# Patient Record
Sex: Female | Born: 1935 | Race: White | Hispanic: No | Smoking: Never smoker
Health system: Southern US, Community
[De-identification: ages and names within clinical notes are randomized; demographics above are authoritative.]

## PROBLEM LIST (undated history)

## (undated) DIAGNOSIS — F32A Depression, unspecified: Secondary | ICD-10-CM

## (undated) DIAGNOSIS — N179 Acute kidney failure, unspecified: Secondary | ICD-10-CM

## (undated) DIAGNOSIS — F0390 Unspecified dementia without behavioral disturbance: Secondary | ICD-10-CM

## (undated) DIAGNOSIS — E119 Type 2 diabetes mellitus without complications: Secondary | ICD-10-CM

## (undated) DIAGNOSIS — F329 Major depressive disorder, single episode, unspecified: Secondary | ICD-10-CM

## (undated) DIAGNOSIS — E7800 Pure hypercholesterolemia, unspecified: Secondary | ICD-10-CM

## (undated) DIAGNOSIS — F22 Delusional disorders: Secondary | ICD-10-CM

## (undated) DIAGNOSIS — N390 Urinary tract infection, site not specified: Secondary | ICD-10-CM

## (undated) DIAGNOSIS — F419 Anxiety disorder, unspecified: Secondary | ICD-10-CM

## (undated) DIAGNOSIS — I10 Essential (primary) hypertension: Secondary | ICD-10-CM

## (undated) DIAGNOSIS — A419 Sepsis, unspecified organism: Secondary | ICD-10-CM

## (undated) DIAGNOSIS — J189 Pneumonia, unspecified organism: Secondary | ICD-10-CM

---

## 2004-08-30 ENCOUNTER — Ambulatory Visit: Payer: Self-pay | Admitting: Hematology

## 2013-02-22 DIAGNOSIS — A419 Sepsis, unspecified organism: Secondary | ICD-10-CM

## 2013-02-22 DIAGNOSIS — N179 Acute kidney failure, unspecified: Secondary | ICD-10-CM

## 2013-02-22 HISTORY — DX: Sepsis, unspecified organism: A41.9

## 2013-02-22 HISTORY — DX: Acute kidney failure, unspecified: N17.9

## 2013-03-06 ENCOUNTER — Inpatient Hospital Stay (HOSPITAL_COMMUNITY)
Admission: AD | Admit: 2013-03-06 | Discharge: 2013-03-13 | DRG: 871 | Disposition: A | Discharge status: Skilled Nursing Facility | Payer: Medicare Other | Source: Other Acute Inpatient Hospital | Attending: Internal Medicine | Admitting: Internal Medicine

## 2013-03-06 ENCOUNTER — Inpatient Hospital Stay (HOSPITAL_COMMUNITY): Payer: Medicare Other

## 2013-03-06 ENCOUNTER — Encounter (HOSPITAL_COMMUNITY): Payer: Self-pay | Admitting: Pulmonary Disease

## 2013-03-06 ENCOUNTER — Inpatient Hospital Stay (HOSPITAL_COMMUNITY): Payer: Medicare Other | Admitting: Anesthesiology

## 2013-03-06 ENCOUNTER — Encounter (HOSPITAL_COMMUNITY): Payer: Medicare Other | Admitting: Anesthesiology

## 2013-03-06 DIAGNOSIS — R0902 Hypoxemia: Secondary | ICD-10-CM | POA: Diagnosis present

## 2013-03-06 DIAGNOSIS — E7800 Pure hypercholesterolemia, unspecified: Secondary | ICD-10-CM | POA: Diagnosis present

## 2013-03-06 DIAGNOSIS — I10 Essential (primary) hypertension: Secondary | ICD-10-CM | POA: Diagnosis present

## 2013-03-06 DIAGNOSIS — A419 Sepsis, unspecified organism: Principal | ICD-10-CM | POA: Diagnosis present

## 2013-03-06 DIAGNOSIS — G92 Toxic encephalopathy: Secondary | ICD-10-CM | POA: Diagnosis not present

## 2013-03-06 DIAGNOSIS — J690 Pneumonitis due to inhalation of food and vomit: Secondary | ICD-10-CM | POA: Diagnosis present

## 2013-03-06 DIAGNOSIS — N12 Tubulo-interstitial nephritis, not specified as acute or chronic: Secondary | ICD-10-CM | POA: Diagnosis present

## 2013-03-06 DIAGNOSIS — K6289 Other specified diseases of anus and rectum: Secondary | ICD-10-CM | POA: Diagnosis not present

## 2013-03-06 DIAGNOSIS — E872 Acidosis, unspecified: Secondary | ICD-10-CM | POA: Diagnosis present

## 2013-03-06 DIAGNOSIS — F411 Generalized anxiety disorder: Secondary | ICD-10-CM | POA: Diagnosis present

## 2013-03-06 DIAGNOSIS — A498 Other bacterial infections of unspecified site: Secondary | ICD-10-CM | POA: Diagnosis present

## 2013-03-06 DIAGNOSIS — N179 Acute kidney failure, unspecified: Secondary | ICD-10-CM | POA: Diagnosis present

## 2013-03-06 DIAGNOSIS — E876 Hypokalemia: Secondary | ICD-10-CM | POA: Diagnosis not present

## 2013-03-06 DIAGNOSIS — F22 Delusional disorders: Secondary | ICD-10-CM | POA: Diagnosis present

## 2013-03-06 DIAGNOSIS — E860 Dehydration: Secondary | ICD-10-CM | POA: Diagnosis present

## 2013-03-06 DIAGNOSIS — N390 Urinary tract infection, site not specified: Secondary | ICD-10-CM | POA: Diagnosis present

## 2013-03-06 DIAGNOSIS — G929 Unspecified toxic encephalopathy: Secondary | ICD-10-CM | POA: Diagnosis not present

## 2013-03-06 DIAGNOSIS — R6521 Severe sepsis with septic shock: Secondary | ICD-10-CM

## 2013-03-06 DIAGNOSIS — G9340 Encephalopathy, unspecified: Secondary | ICD-10-CM | POA: Diagnosis present

## 2013-03-06 DIAGNOSIS — K838 Other specified diseases of biliary tract: Secondary | ICD-10-CM | POA: Diagnosis present

## 2013-03-06 DIAGNOSIS — J189 Pneumonia, unspecified organism: Secondary | ICD-10-CM | POA: Clinically undetermined

## 2013-03-06 DIAGNOSIS — F0390 Unspecified dementia without behavioral disturbance: Secondary | ICD-10-CM | POA: Diagnosis present

## 2013-03-06 DIAGNOSIS — R000 Tachycardia, unspecified: Secondary | ICD-10-CM | POA: Diagnosis not present

## 2013-03-06 DIAGNOSIS — E119 Type 2 diabetes mellitus without complications: Secondary | ICD-10-CM | POA: Diagnosis present

## 2013-03-06 DIAGNOSIS — D649 Anemia, unspecified: Secondary | ICD-10-CM | POA: Diagnosis present

## 2013-03-06 DIAGNOSIS — K920 Hematemesis: Secondary | ICD-10-CM | POA: Diagnosis present

## 2013-03-06 HISTORY — DX: Type 2 diabetes mellitus without complications: E11.9

## 2013-03-06 HISTORY — DX: Pure hypercholesterolemia, unspecified: E78.00

## 2013-03-06 HISTORY — DX: Unspecified dementia, unspecified severity, without behavioral disturbance, psychotic disturbance, mood disturbance, and anxiety: F03.90

## 2013-03-06 HISTORY — DX: Essential (primary) hypertension: I10

## 2013-03-06 HISTORY — DX: Delusional disorders: F22

## 2013-03-06 HISTORY — DX: Anxiety disorder, unspecified: F41.9

## 2013-03-06 LAB — CBC
HCT: 27.7 % — ABNORMAL LOW (ref 36.0–46.0)
Hemoglobin: 9.4 g/dL — ABNORMAL LOW (ref 12.0–15.0)
MCHC: 33.9 g/dL (ref 30.0–36.0)
Platelets: 166 10*3/uL (ref 150–400)
RDW: 14.6 % (ref 11.5–15.5)
WBC: 7.3 10*3/uL (ref 4.0–10.5)

## 2013-03-06 LAB — GLUCOSE, CAPILLARY
Glucose-Capillary: 106 mg/dL — ABNORMAL HIGH (ref 70–99)
Glucose-Capillary: 142 mg/dL — ABNORMAL HIGH (ref 70–99)
Glucose-Capillary: 92 mg/dL (ref 70–99)
Glucose-Capillary: 99 mg/dL (ref 70–99)
Glucose-Capillary: 77 mg/dL (ref 70–99)
Glucose-Capillary: 76 mg/dL (ref 70–99)

## 2013-03-06 LAB — CBC WITH DIFFERENTIAL/PLATELET
Basophils Absolute: 0 10*3/uL (ref 0.0–0.1)
Eosinophils Absolute: 0 10*3/uL (ref 0.0–0.7)
Eosinophils Relative: 0 % (ref 0–5)
HCT: 29.7 % — ABNORMAL LOW (ref 36.0–46.0)
Lymphocytes Relative: 6 % — ABNORMAL LOW (ref 12–46)
MCH: 30.6 pg (ref 26.0–34.0)
MCHC: 32.3 g/dL (ref 30.0–36.0)
MCV: 94.6 fL (ref 78.0–100.0)
Monocytes Absolute: 1.1 10*3/uL — ABNORMAL HIGH (ref 0.1–1.0)
Platelets: 221 10*3/uL (ref 150–400)
RDW: 14.9 % (ref 11.5–15.5)
WBC: 11.8 10*3/uL — ABNORMAL HIGH (ref 4.0–10.5)

## 2013-03-06 LAB — TROPONIN I
Troponin I: <0.3 ng/mL (ref <0.30)
Troponin I: <0.3 ng/mL (ref <0.30)

## 2013-03-06 LAB — PROCALCITONIN: Procalcitonin: 10.1 ng/mL

## 2013-03-06 LAB — CORTISOL: Cortisol, Plasma: 25.8 ug/dL

## 2013-03-06 LAB — POCT I-STAT 3, ART BLOOD GAS (G3+)
Acid-base deficit: 3 mmol/L — ABNORMAL HIGH (ref 0.0–2.0)
Bicarbonate: 22.9 mEq/L (ref 20.0–24.0)
TCO2: 24 mmol/L (ref 0–100)
pH, Arterial: 7.306 — ABNORMAL LOW (ref 7.350–7.450)
pO2, Arterial: 76 mmHg — ABNORMAL LOW (ref 80.0–100.0)

## 2013-03-06 LAB — COMPREHENSIVE METABOLIC PANEL
ALT: 17 U/L (ref 0–35)
Albumin: 2.8 g/dL — ABNORMAL LOW (ref 3.5–5.2)
Alkaline Phosphatase: 51 U/L (ref 39–117)
BUN: 47 mg/dL — ABNORMAL HIGH (ref 6–23)
Chloride: 102 mEq/L (ref 96–112)
Creatinine, Ser: 2.91 mg/dL — ABNORMAL HIGH (ref 0.50–1.10)
Potassium: 4.9 mEq/L (ref 3.5–5.1)
Sodium: 138 mEq/L (ref 135–145)
Total Bilirubin: 0.2 mg/dL — ABNORMAL LOW (ref 0.3–1.2)
Total Protein: 5.1 g/dL — ABNORMAL LOW (ref 6.0–8.3)

## 2013-03-06 LAB — MRSA PCR SCREENING: MRSA by PCR: NEGATIVE

## 2013-03-06 LAB — LACTIC ACID, PLASMA: Lactic Acid, Venous: 1.4 mmol/L (ref 0.5–2.2)

## 2013-03-06 LAB — AMYLASE: Amylase: 192 U/L — ABNORMAL HIGH (ref 0–105)

## 2013-03-06 LAB — PRO B NATRIURETIC PEPTIDE: Pro B Natriuretic peptide (BNP): 788.5 pg/mL — ABNORMAL HIGH (ref 0–450)

## 2013-03-06 MED ORDER — DEXTROSE 5 % IV SOLN
2.0000 ug/min | INTRAVENOUS | Status: DC
  Administered 2013-03-06: 8 ug/min via INTRAVENOUS
  Filled 2013-03-06 (×2): qty 4

## 2013-03-06 MED ORDER — INSULIN ASPART 100 UNIT/ML ~~LOC~~ SOLN: 0.0000 [IU] | SUBCUTANEOUS | Status: DC

## 2013-03-06 MED ORDER — PANTOPRAZOLE SODIUM 40 MG IV SOLR
40.0000 mg | Freq: Two times a day (BID) | INTRAVENOUS | Status: DC
  Administered 2013-03-06 (×2): 40 mg via INTRAVENOUS
  Filled 2013-03-06 (×5): qty 40

## 2013-03-06 MED ORDER — LORAZEPAM 2 MG/ML IJ SOLN
0.5000 mg | INTRAMUSCULAR | Status: DC | PRN
  Administered 2013-03-06 (×2): 0.5 mg via INTRAVENOUS
  Filled 2013-03-06: qty 1

## 2013-03-06 MED ORDER — HEPARIN SODIUM (PORCINE) 5000 UNIT/ML IJ SOLN
5000.0000 [IU] | Freq: Three times a day (TID) | INTRAMUSCULAR | Status: DC
  Administered 2013-03-06 – 2013-03-07 (×4): 5000 [IU] via SUBCUTANEOUS
  Filled 2013-03-06 (×7): qty 1

## 2013-03-06 MED ORDER — SODIUM CHLORIDE 0.9 % IV SOLN
INTRAVENOUS | Status: AC
  Administered 2013-03-06 (×3): via INTRAVENOUS

## 2013-03-06 MED ORDER — DEXTROSE 50 % IV SOLN: 25.0000 mL | Freq: Once | INTRAVENOUS | Status: AC

## 2013-03-06 MED ORDER — PIPERACILLIN-TAZOBACTAM IN DEX 2-0.25 GM/50ML IV SOLN
2.2500 g | Freq: Three times a day (TID) | INTRAVENOUS | Status: DC
  Administered 2013-03-06 – 2013-03-07 (×4): 2.25 g via INTRAVENOUS
  Filled 2013-03-06 (×5): qty 50

## 2013-03-06 MED ORDER — DEXTROSE-NACL 5-0.9 % IV SOLN
INTRAVENOUS | Status: DC
  Administered 2013-03-06 – 2013-03-07 (×2): via INTRAVENOUS

## 2013-03-06 MED ORDER — PIPERACILLIN-TAZOBACTAM 3.375 G IVPB 30 MIN
3.3750 g | Freq: Once | INTRAVENOUS | Status: AC
  Administered 2013-03-06: 3.375 g via INTRAVENOUS
  Filled 2013-03-06: qty 50

## 2013-03-06 MED ORDER — SODIUM CHLORIDE 0.9 % IV BOLUS (SEPSIS)
500.0000 mL | Freq: Once | INTRAVENOUS | Status: AC
Start: 2013-03-06 — End: 2013-03-06
  Administered 2013-03-06: 500 mL via INTRAVENOUS

## 2013-03-06 MED ORDER — DEXTROSE 50 % IV SOLN
INTRAVENOUS | Status: AC
  Administered 2013-03-06: 25 mL
  Filled 2013-03-06: qty 50

## 2013-03-06 MED ORDER — INSULIN ASPART 100 UNIT/ML ~~LOC~~ SOLN
1.0000 [IU] | SUBCUTANEOUS | Status: DC
  Administered 2013-03-06: 1 [IU] via SUBCUTANEOUS

## 2013-03-06 MED ORDER — HALOPERIDOL LACTATE 5 MG/ML IJ SOLN: 1.0000 mg | INTRAMUSCULAR | Status: DC | PRN

## 2013-03-06 NOTE — Care Management Note (Addendum)
Page 1 of 2   03/12/2013     4:29:35 PM   CARE MANAGEMENT NOTE 03/12/2013  Patient:  Yolanda Medina, Yolanda Medina   Account Number:  [id number]  Date Initiated:  03/06/2013  Documentation initiated by:  Avie Arenas  Subjective/Objective Assessment:   tx from [facility] - septic, renal failure.     Action/Plan:   pt eval- rec hhpt   Anticipated DC Date:  03/12/2013   Anticipated DC Plan:  SKILLED NURSING FACILITY  In-house referral  Clinical Social Worker      DC Associate Professor  CM consult      [facility] Choice  HOME HEALTH   Choice offered to / List presented to:  C-1 Patient        HH arranged  HH-1 RN  HH-2 PT  HH-4 NURSE'S AIDE      HH agency  [facility]   Status of service:  Completed, signed off Medicare Important Message given?   (If response is "NO", the following Medicare IM given date fields will be blank) Date Medicare IM given:   Date Additional Medicare IM given:    Discharge Disposition:    Per UR Regulation:  Reviewed for med. necessity/level of care/duration of stay  If discussed at Long Length of Stay Meetings, dates discussed:    Comments:  ContactAugustina Mood [phone]                           Yolanda Medina Styles [phone]  03/12/13 16:27 Letha Cape RN, BSN [phone] patient has now decided to go to SNF, CSW has been contacted and spoke with patient.  Called [facility] and told them to put [facility] on hold until further notice.  CSW working up for SNF.  03/12/13 11:20 Letha Cape RN, BSN [phone] patient for dc today, patient chose [facility] , faxed to [facility].  Soc will begin within 24-48 hrs post discharge.   Patient has medication coverage and transportation at dc.  Received call from [facility] stating they could not take patient.  Informed patient and she chose [facility] , referral made to [facility] .  Patient for dc tomorrow.  Patient lives with Yolanda Medina her son , who is disabled, who Md  has not been able to contact.  MD  also left message for Yolanda Medina the other son on vm and he  has not called the MD back.    NCM left message for other son Yolanda Medina to call, beause pateint is trying to call him from her room to see if he would be able to pick her up at dc.  Recieved call from Yolanda Medina who states he was not satified with patient's care in hospital because he has not received any information from any MD about his mother.  Informed son that patient was just transferred to this medical unit yesterday and a new MD just started following patient. Yolanda Medina stated he will be here tomorrow to pick patient up at 10 am.  NCM informed MD of this information.  All previous notes in chart state patient is from SNF, but NCM spoke with Yolanda Medina on patient hospital phone while patient was talking to him and he states patient is from home with him and she has not been in a [place] representative.  Per physical therapy recs SNF for this admission and patient is refusing SNF at this time , stating she wants to go home. MD  is aware of this.  03-06-13  11am Avie Arenas, RNBSN [phone] Patient in bed - restless, mittens on.  Does not look at you when you address her name.  Nurse states lives with son, Yolanda Medina.  Called Timothy's above number and left message to call back.   Called Jerry's number.  Got a hold of his wife who referred [place] to Tim as mother does life with him.  Awaiting call back.

## 2013-03-06 NOTE — Progress Notes (Signed)
Hypoglycemic Event  CBG: 58  Treatment: D50 IV 25 mL  Symptoms: None  Follow-up CBG: Time:1245 CBG Result:99  Possible Reasons for Event: Inadequate meal intake  Comments/MD notified:Simonds    Yolanda Medina  Remember to initiate Hypoglycemia Order Set & complete

## 2013-03-06 NOTE — Progress Notes (Signed)
79yo female went from SNF to OSH for coffee-ground emesis and AMS, found to be in ARF, no clear evidence of GIB, concern for intra-abdominal infectious process per CT and possible aspiration PNA, to begin IV ABX.  Will start Zosyn 2.25g IV Q8H for CrCl currently <20 and monitor CrCl to adjust.  Vernard Gambles, PharmD, BCPS 03/06/2013 2:58 AM

## 2013-03-06 NOTE — Progress Notes (Signed)
RT attempted to stick [name] with no success. RT unable to thread catheter. RN aware.

## 2013-03-06 NOTE — Consult Note (Signed)
Reason for Consult:portal venous gas, sepsis Referring Physician: Dr Sueanne Margarita  Yolanda Medina is an 79 y.o. female.  HPI: 67 yof transferred from outside hospital with presumed gi bleed, shock, acute renal failure.  Patient is confused and I have reviewed Dr Brynda Greathouse note and notes from other hospital.  The patient is difficult to understand but did state she did not have abdominal pain.  She arrived at [facility] er with confusion and then was found to have hypotension and heme positive emesis.  Her cr was also elevated. Underwent ct head that showed some chronic changes.  CT a/p shows some portal venous gas but without any other real abnormality.  I have reviewed the scans as well as report. She appears to reside in a skilled nursing facility.  Past Medical History  Diagnosis Date  . Dementia   . Anxiety   . Paranoia   . Hypertension   . Hypercholesterolemia   . Diabetes mellitus     No past surgical history on file.- she has had hysterectomy  No family history on file.  Social History:  has no tobacco, alcohol, and drug history on file.  Allergies:  Allergies  Allergen Reactions  . Acetaminophen   . Ciprofloxacin   . Codeine     Medications: I have reviewed the patient's current medications.  Results for orders placed during the hospital encounter of 03/06/13 (from the past 48 hour(s))  MRSA PCR SCREENING     Status: None   Collection Time    03/06/13  1:44 AM      Result Value Range   MRSA by PCR NEGATIVE  NEGATIVE   Comment:            The GeneXpert MRSA Assay (FDA     approved for NASAL specimens     only), is one component of a     comprehensive MRSA colonization     surveillance program. It is not     intended to diagnose MRSA     infection nor to guide or     monitor treatment for     MRSA infections.  GLUCOSE, CAPILLARY     Status: Abnormal   Collection Time    03/06/13  1:46 AM      Result Value Range   Glucose-Capillary 106 (*) 70 - 99 mg/dL   Comment 1 Documented in Chart     Comment 2 Notify RN    TROPONIN I     Status: None   Collection Time    03/06/13  2:01 AM      Result Value Range   Troponin I <0.30  <0.30 ng/mL   Comment:            Due to the release kinetics of cTnI,     a negative result within the first hours     of the onset of symptoms does not rule out     myocardial infarction with certainty.     If myocardial infarction is still suspected,     repeat the test at appropriate intervals.  LACTIC ACID, PLASMA     Status: None   Collection Time    03/06/13  2:01 AM      Result Value Range   Lactic Acid, Venous 1.4  0.5 - 2.2 mmol/L  PROCALCITONIN     Status: None   Collection Time    03/06/13  2:01 AM      Result Value Range   Procalcitonin 10.10  Comment:            Interpretation:     PCT >= 10 ng/mL:     Important systemic inflammatory response,     almost exclusively due to severe bacterial     sepsis or septic shock.     (NOTE)             ICU PCT Algorithm               Non ICU PCT Algorithm        ----------------------------     ------------------------------             PCT < 0.25 ng/mL                 PCT < 0.1 ng/mL         Stopping of antibiotics            Stopping of antibiotics           strongly encouraged.               strongly encouraged.        ----------------------------     ------------------------------           PCT level decrease by               PCT < 0.25 ng/mL           >= 80% from peak PCT           OR PCT 0.25 - 0.5 ng/mL          Stopping of antibiotics                                                 encouraged.         Stopping of antibiotics               encouraged.        ----------------------------     ------------------------------           PCT level decrease by              PCT >= 0.25 ng/mL           < 80% from peak PCT            AND PCT >= 0.5 ng/mL            Continuing antibiotics                                                  encouraged.            Continuing antibiotics                encouraged.        ----------------------------     ------------------------------         PCT level increase compared          PCT > 0.5 ng/mL             with peak PCT AND              PCT >= 0.5 ng/mL  Escalation of antibiotics                                              strongly encouraged.          Escalation of antibiotics            strongly encouraged.  PRO B NATRIURETIC PEPTIDE     Status: Abnormal   Collection Time    03/06/13  2:01 AM      Result Value Range   Pro B Natriuretic peptide (BNP) 788.5 (*) 0 - 450 pg/mL  POCT I-STAT 3, BLOOD GAS (G3+)     Status: Abnormal   Collection Time    03/06/13  2:38 AM      Result Value Range   pH, Arterial 7.306 (*) 7.350 - 7.450   pCO2 arterial 46.2 (*) 35.0 - 45.0 mmHg   pO2, Arterial 76.0 (*) 80.0 - 100.0 mmHg   Bicarbonate 22.9  20.0 - 24.0 mEq/L   TCO2 24  0 - 100 mmol/L   O2 Saturation 93.0     Acid-base deficit 3.0 (*) 0.0 - 2.0 mmol/L   Patient temperature 99.4 F     Collection site RADIAL, ALLEN'S TEST ACCEPTABLE     Drawn by RT     Sample type ARTERIAL    COMPREHENSIVE METABOLIC PANEL     Status: Abnormal   Collection Time    03/06/13  2:45 AM      Result Value Range   Sodium 138  135 - 145 mEq/L   Potassium 4.9  3.5 - 5.1 mEq/L   Chloride 102  96 - 112 mEq/L   CO2 24  19 - 32 mEq/L   Glucose, Bld 122 (*) 70 - 99 mg/dL   BUN 47 (*) 6 - 23 mg/dL   Creatinine, Ser 0.34 (*) 0.50 - 1.10 mg/dL   Calcium 6.8 (*) 8.4 - 10.5 mg/dL   Total Protein 5.1 (*) 6.0 - 8.3 g/dL   Albumin 2.8 (*) 3.5 - 5.2 g/dL   AST 56 (*) 0 - 37 U/L   ALT 17  0 - 35 U/L   Alkaline Phosphatase 51  39 - 117 U/L   Total Bilirubin 0.2 (*) 0.3 - 1.2 mg/dL   GFR calc non Af Amer 15 (*) >90 mL/min   GFR calc Af Amer 17 (*) >90 mL/min   Comment: (NOTE)     The eGFR has been calculated using the CKD EPI equation.     This calculation has not been validated in all clinical situations.     eGFR's  persistently <90 mL/min signify possible Chronic Kidney     Disease.  AMYLASE     Status: Abnormal   Collection Time    03/06/13  2:45 AM      Result Value Range   Amylase 192 (*) 0 - 105 U/L  LIPASE, BLOOD     Status: None   Collection Time    03/06/13  2:45 AM      Result Value Range   Lipase 26  11 - 59 U/L  MAGNESIUM     Status: None   Collection Time    03/06/13  2:45 AM      Result Value Range   Magnesium 1.6  1.5 - 2.5 mg/dL  PHOSPHORUS     Status: Abnormal   Collection Time  03/06/13  2:45 AM      Result Value Range   Phosphorus 6.5 (*) 2.3 - 4.6 mg/dL  CBC WITH DIFFERENTIAL     Status: Abnormal   Collection Time    03/06/13  2:45 AM      Result Value Range   WBC 11.8 (*) 4.0 - 10.5 K/uL   RBC 3.14 (*) 3.87 - 5.11 MIL/uL   Hemoglobin 9.6 (*) 12.0 - 15.0 g/dL   HCT 82.9 (*) 56.2 - 13.0 %   MCV 94.6  78.0 - 100.0 fL   MCH 30.6  26.0 - 34.0 pg   MCHC 32.3  30.0 - 36.0 g/dL   RDW 86.5  78.4 - 69.6 %   Platelets 221  150 - 400 K/uL   Neutrophils Relative % 84 (*) 43 - 77 %   Neutro Abs 10.0 (*) 1.7 - 7.7 K/uL   Lymphocytes Relative 6 (*) 12 - 46 %   Lymphs Abs 0.8  0.7 - 4.0 K/uL   Monocytes Relative 9  3 - 12 %   Monocytes Absolute 1.1 (*) 0.1 - 1.0 K/uL   Eosinophils Relative 0  0 - 5 %   Eosinophils Absolute 0.0  0.0 - 0.7 K/uL   Basophils Relative 0  0 - 1 %   Basophils Absolute 0.0  0.0 - 0.1 K/uL  GLUCOSE, CAPILLARY     Status: Abnormal   Collection Time    03/06/13  5:14 AM      Result Value Range   Glucose-Capillary 142 (*) 70 - 99 mg/dL   Comment 1 Documented in Chart     Comment 2 Notify RN      Dg Chest Port 1 View  03/06/2013   CLINICAL DATA:  Hypoxia.  EXAM: PORTABLE CHEST - 1 VIEW  COMPARISON:  None.  FINDINGS: The lungs are mildly hypoexpanded. Retrocardiac airspace opacification raises concern for pneumonia. Mild vascular crowding and vascular congestion are seen. A small left pleural effusion is suspected. No pneumothorax is identified.   The cardiomediastinal silhouette is borderline normal in size. Calcification is noted within the aortic arch. No acute osseous abnormalities are seen. The patient is status post left-sided rotator cuff repair. An enteric tube is noted ending overlying the antrum of the stomach.  IMPRESSION: Lungs mildly hypoexpanded. Retrocardiac airspace opacification raises concern for pneumonia; suspect small left pleural effusion. Mild vascular congestion seen.   Electronically Signed   By: Roanna Raider M.D.   On: 03/06/2013 02:43    Review of Systems  Unable to perform ROS: dementia   Blood pressure 106/45, pulse 89, temperature 98.8 F (37.1 C), temperature source Oral, resp. rate 21, height 5\' 3"  (1.6 m), weight 143 lb 8.3 oz (65.1 kg), SpO2 97.00%. Physical Exam  Vitals reviewed. Constitutional: She appears well-developed.  HENT:  Head: Normocephalic and atraumatic.  Eyes: No scleral icterus.  Neck: Neck supple.  Cardiovascular: Normal rate, regular rhythm and normal heart sounds.   Respiratory: Effort normal and breath sounds normal. She has no wheezes.  GI: Soft. Normal appearance and bowel sounds are normal. She exhibits no distension. There is no tenderness. No hernia.    Lymphadenopathy:    She has no cervical adenopathy.    Assessment/Plan: Sepsis, ? Gi bleed, portal venous gas  No evidence of gi bleed that I can see right now.  On PPI.  Would trend hct, monitor ng output and bowel function Sepsis possibly intraabdominal etiology.  She does have some portal venous gas but  no source identified on her ct scan.  She has no abdominal tenderness on her exam either. I think conservative therapy with antibiotics, ng tube drainage as she has, monitor for bowel function, monitor hct is reasonable.  If she does not improve, worsens, develops abdominal pain then there is some chance she may need to be explored.  Isay Perleberg 03/06/2013, 6:11 AM

## 2013-03-06 NOTE — H&P (Signed)
PULMONARY  / CRITICAL CARE MEDICINE  Name: Yolanda Medina MRN: [id number] DOB: 07-12-1933    ADMISSION DATE:  03/06/2013 CONSULTATION DATE:  03/06/2013  REFERRING MD :  OSH ED PRIMARY SERVICE: PCCM  CHIEF COMPLAINT:  Confusion  BRIEF PATIENT DESCRIPTION: 79 y/o female with dementia who lives in a SNF was admitted on 11/13 in the early AM on transfer from the [facility] ED for confusion and a possible GI bleed.  SIGNIFICANT EVENTS / STUDIES:  11/12 CT head OSH> NAICP, small amount of maxillary sinus fluid, microvascular change 11/12 CT abdomen> no free air, small amount of portal venous gas,   LINES / TUBES: 11/12 L femoral vein cvl >>  CULTURES: 11/13 blood >> 11/13 urine >>  ANTIBIOTICS: 11/13 zosyn >>  HISTORY OF PRESENT ILLNESS:  79 y/o female with dementia who lives in a SNF was admitted on 11/13 in the early AM on transfer from the [facility] ED for confusion and a possible GI bleed.  She was confused on my exam and no family was available so history was obtained by chart review.  As I piece things together, it looks like she came in from her nursing home on 11/12 confused, hypoxemic and developed shock.  In the ED she vomited "coffee grounds" which were heme positive.  She was found to be in acute renal failure.    PAST MEDICAL HISTORY :  Past Medical History  Diagnosis Date  . Dementia   . Anxiety   . Paranoia   . Hypertension   . Hypercholesterolemia   . Diabetes mellitus    No past surgical history on file. Prior to Admission medications   Medication Sig Start Date End Date Taking? Authorizing Provider  albuterol (PROVENTIL HFA;VENTOLIN HFA) 108 (90 BASE) MCG/ACT inhaler Inhale into the lungs every 4 (four) hours as needed for wheezing or shortness of breath.   Yes Historical Provider, MD  alprazolam Prudy Feeler) 2 MG tablet Take 2 mg by mouth 3 (three) times daily as needed for sleep.   Yes Historical Provider, MD  chlordiazePOXIDE (LIBRIUM) 10 MG capsule Take 10 mg  by mouth at bedtime as needed (sleep).   Yes Historical Provider, MD  chlorzoxazone (PARAFON) 500 MG tablet Take 750 mg by mouth 3 (three) times daily.   Yes Historical Provider, MD  citalopram (CELEXA) 40 MG tablet Take 40 mg by mouth daily.   Yes Historical Provider, MD  doxepin (SINEQUAN) 50 MG capsule Take 50 mg by mouth at bedtime as needed.   Yes Historical Provider, MD  gabapentin (NEURONTIN) 300 MG capsule Take 300 mg by mouth 3 (three) times daily.   Yes Historical Provider, MD  hydrOXYzine (ATARAX/VISTARIL) 25 MG tablet Take 25 mg by mouth 3 (three) times daily as needed.   Yes Historical Provider, MD  lisinopril (PRINIVIL,ZESTRIL) 20 MG tablet Take 20 mg by mouth daily.   Yes Historical Provider, MD  metFORMIN (GLUCOPHAGE) 850 MG tablet Take 850 mg by mouth 2 (two) times daily with a meal.   Yes Historical Provider, MD  omeprazole (PRILOSEC) 20 MG capsule Take 20 mg by mouth daily.   Yes Historical Provider, MD  oxybutynin (DITROPAN) 5 MG tablet Take 5 mg by mouth 2 (two) times daily.   Yes Historical Provider, MD  oxyCODONE-acetaminophen (PERCOCET) 10-325 MG per tablet Take 1 tablet by mouth every 12 (twelve) hours.   Yes Historical Provider, MD  pravastatin (PRAVACHOL) 20 MG tablet Take 20 mg by mouth daily.   Yes Historical Provider, MD  temazepam (RESTORIL) 30 MG capsule Take 30 mg by mouth at bedtime as needed for sleep.   Yes Historical Provider, MD  traMADol (ULTRAM) 50 MG tablet Take by mouth every 6 (six) hours as needed for moderate pain.   Yes Historical Provider, MD  zolpidem (AMBIEN) 10 MG tablet Take 10 mg by mouth at bedtime as needed for sleep.   Yes Historical Provider, MD   Allergies  Allergen Reactions  . Acetaminophen   . Ciprofloxacin   . Codeine     FAMILY HISTORY/SOCIAL HISTORY/REVIEW OF SYSTEMS:  Cannot obtain due to confusion  SUBJECTIVE:   VITAL SIGNS: Weight:  [64.229 kg (141 lb 9.6 oz)] 64.229 kg (141 lb 9.6 oz) (11/13 0100) HEMODYNAMICS:    VENTILATOR SETTINGS:   INTAKE / OUTPUT: Intake/Output   None     PHYSICAL EXAMINATION: General:  Chronically ill appearing, confused Neuro:  Confused, follows simple commands, moves all four ext HEENT:  NCAT, PERRL, EOMi, MM profoundly dry Cardiovascular:  RRR, systolic murmur noted Lungs:  CTA B Abdomen: BS infrequent but noted, nontender, no guarding or rebound Musculoskeletal:  Normal bulk and tone Skin:  No rash or skin breakdown  LABS:  OSH labs reviewed, WBC 10, Hgb 13.1, PLT 262, normal electrolytes, C 2.8, GAP 20; ABG with metabolic acidosis (pH 7.20), lactic acid 1.2  CBC No results found for this basename: WBC, HGB, HCT, PLT,  in the last 168 hours Coag's No results found for this basename: APTT, INR,  in the last 168 hours BMET No results found for this basename: NA, K, CL, CO2, BUN, CREATININE, GLUCOSE,  in the last 168 hours Electrolytes No results found for this basename: CALCIUM, MG, PHOS,  in the last 168 hours Sepsis Markers No results found for this basename: LATICACIDVEN, PROCALCITON, O2SATVEN,  in the last 168 hours ABG No results found for this basename: PHART, PCO2ART, PO2ART,  in the last 168 hours Liver Enzymes No results found for this basename: AST, ALT, ALKPHOS, BILITOT, ALBUMIN,  in the last 168 hours Cardiac Enzymes No results found for this basename: TROPONINI, PROBNP,  in the last 168 hours Glucose  Recent Labs Lab 03/06/13 0146  GLUCAP 106*    Imaging No results found.   CXR: pending EKG: NSR, LBBB  ASSESSMENT / PLAN:  GASTROINTESTINAL A:   Nausea and vomiting > gastroenteritis, no clear evidence of GI bleed Portal venous gas > uncertain etiology, worrisome for more severe enteric pathology, but exam normal P:   -consult general surgery -LFT's now -amylase/lipase now -NG tube -change PPI drip to bid -repeat CBC  INFECTIOUS A:  Severe sepsis > most worrisome for intra-abdominal pathology given CT findings but exam  normal; other possible source aspiration pneumonia, less likely UTI P:   -repeat U/A and culture -CXR  PULMONARY A: Mild hypoxemia from aspiration pneumonia P:   -O2 as needed -HOB > 30 degrees  CARDIOVASCULAR A: Shock> presumably septic, appears profoundly hypovolemic on exam P:  -check lactic acid now -a-line now -bolus more saline now and increase rate to 200cc/hr for 8 hours -tele -continue femoral CVL for now (placed under sterile conditions), but consider change to IJ or subclavian if still needs pressors after fluid bolus -consider a-line if no improvement with IVF -EKG -cortisol -wean levophed  RENAL A:  AKI Anion gap metabolic acidosis P:   -check lactic acid now -abg now -bolus saline -monitor UOP -check U/A   HEMATOLOGIC A:  GI Bleed? No clear evidence  P:  -change  PPI to bid -monitor cbc  ENDOCRINE A:  DM2 P:   -ICU hyperglycemia protocol  NEUROLOGIC A:  Acute encephalopathy from sepsis Dementia  Polypharmacy clearly contributing to encephalopathy (multiple benzo's on med list) P:   -hold home meds -minimize sedating meds  Code: presumed full Family: tried calling listed contact  TODAY'S SUMMARY:   I have personally obtained a history, examined the patient, evaluated laboratory and imaging results, formulated the assessment and plan and placed orders. CRITICAL CARE: The patient is critically ill with multiple organ systems failure and requires high complexity decision making for assessment and support, frequent evaluation and titration of therapies, application of advanced monitoring technologies and extensive interpretation of multiple databases. Critical Care Time devoted to patient care services described in this note is 60 minutes.   Fonnie Jarvis Pulmonary and Critical Care Medicine [facility] Pager: [phone]  03/06/2013, 2:13 AM

## 2013-03-06 NOTE — Progress Notes (Signed)
PULMONARY  / CRITICAL CARE MEDICINE  Name: Yolanda Medina MRN: [id number] DOB: February 08, 1934    ADMISSION DATE:  03/06/2013 CONSULTATION DATE:  03/06/2013  REFERRING MD :  [facility] ED PRIMARY SERVICE: PCCM  CHIEF COMPLAINT:  Confusion  BRIEF PATIENT DESCRIPTION: 79 y/o female with dementia who lives in a SNF was admitted on 11/13 in the early AM on transfer from the [place] ED for confusion and a possible GI bleed.  SIGNIFICANT EVENTS / STUDIES:  11/12 CT head [facility] Salvia):  NAICP 11/12 CT abdomen:  no free air, small amount of portal venous gas 11/13 Off vasopressors.    LINES / TUBES: L fem CVL 11/12 >>   MICRO: PCT 11/13: 10.13,   11/14:     11/15:   blood 11/13 >> urine 11/13 >>  ANTIBIOTICS: Pip-tazo 11/13 >>   SUBJECTIVE:  No distress. Off pressors. + F/C. Poorly oriented   VITAL SIGNS: Temp:  [98.2 F (36.8 C)-100.5 F (38.1 C)] 100.5 F (38.1 C) (11/13 1100) Pulse Rate:  [86-109] 108 (11/13 1300) Resp:  [11-27] 19 (11/13 1300) BP: (95-125)/(27-92) 125/52 mmHg (11/13 1200) SpO2:  [95 %-100 %] 97 % (11/13 1300) Arterial Line BP: (115-157)/(31-46) 132/35 mmHg (11/13 1300) Weight:  [64.229 kg (141 lb 9.6 oz)-65.1 kg (143 lb 8.3 oz)] 65.1 kg (143 lb 8.3 oz) (11/13 0538) HEMODYNAMICS:   VENTILATOR SETTINGS:   INTAKE / OUTPUT: Intake/Output     11/12 0701 - 11/13 0700 11/13 0701 - 11/14 0700   I.V. (mL/kg) 1156.6 (17.8) 304.1 (4.7)   Other 2200 600   IV Piggyback 600 50   Total Intake(mL/kg) 3956.6 (60.8) 954.1 (14.7)   Urine (mL/kg/hr) 400 400 (0.9)   Emesis/NG output 100 25 (0.1)   Total Output 500 425   Net +3456.6 +529.1          PHYSICAL EXAMINATION: General:  Chronically ill appearing, confused Neuro:  Confused, follows simple commands, moves all four ext HEENT:  NCAT, PERRL, EOMi, MM profoundly dry Cardiovascular:  RRR, systolic murmur noted Lungs:  CTA B Abdomen: BS infrequent but noted, nontender, no guarding or rebound Musculoskeletal:   Normal bulk and tone Skin:  No rash or skin breakdown  LABS: I have reviewed all of today's lab results. Relevant abnormalities are discussed in the A/P section   CXR: NNF    ASSESSMENT / PLAN:  GASTROINTESTINAL A:   Nausea and vomiting Suspect gastoenteritis Portal venous gas, unclear etiology P:   SUP: IV PPI CCS following Cont NPO until more physiologically stable  INFECTIOUS A:  Severe sepsis, likely abdominal source P:   Micro and abx as above  CARDIOVASCULAR A: Septic shock, resolving Hypovolemic shock, resolved P:  Decrease IVFs to "maintenance" rate Monitor Resume NE as needed to keep MAP > 60 mmHg  PULMONARY A: Mild hypoxemia Possible aspiration  P:   Supplemental O2 to maintain SpO2 > 90% Monitor CXR intermittently  RENAL A:  AKI Mild AG acidosis Oliguria  P:   Monitor BMET intermittently Correct electrolytes as indicated Place Foley cath to track I/Os more closely  HEMATOLOGIC A:   Mild anemia without acute blood loss P:  Monitor CBC intermittently Transfuse for acute blood loss or Hgb < 7.0  ENDOCRINE A:  DM2 Episodic hypoglycemia P:   -ICU hyperglycemia protocol  NEUROLOGIC A: Acute encephalopathy Dementia  Polypharmacy P:    Minimize sedating meds   TODAY'S SUMMARY:   I have personally obtained a history, examined the patient, evaluated laboratory and imaging results, formulated  the assessment and plan and placed orders. CRITICAL CARE: The patient is critically ill with multiple organ systems failure and requires high complexity decision making for assessment and support, frequent evaluation and titration of therapies, application of advanced monitoring technologies and extensive interpretation of multiple databases. Critical Care Time devoted to patient care services described in this note is 30 minutes.   Sharlot Gowda Pulmonary and Critical Care Medicine [facility] Pager: [phone]  03/06/2013, 2:01  PM

## 2013-03-07 ENCOUNTER — Inpatient Hospital Stay (HOSPITAL_COMMUNITY): Payer: Medicare Other

## 2013-03-07 DIAGNOSIS — R109 Unspecified abdominal pain: Secondary | ICD-10-CM

## 2013-03-07 DIAGNOSIS — A419 Sepsis, unspecified organism: Secondary | ICD-10-CM

## 2013-03-07 DIAGNOSIS — N390 Urinary tract infection, site not specified: Secondary | ICD-10-CM | POA: Diagnosis present

## 2013-03-07 DIAGNOSIS — G9340 Encephalopathy, unspecified: Secondary | ICD-10-CM

## 2013-03-07 DIAGNOSIS — N179 Acute kidney failure, unspecified: Secondary | ICD-10-CM

## 2013-03-07 LAB — CBC
HCT: 26 % — ABNORMAL LOW (ref 36.0–46.0)
MCHC: 32.7 g/dL (ref 30.0–36.0)
MCV: 93.2 fL (ref 78.0–100.0)
RDW: 14.8 % (ref 11.5–15.5)

## 2013-03-07 LAB — COMPREHENSIVE METABOLIC PANEL
ALT: 17 U/L (ref 0–35)
AST: 47 U/L — ABNORMAL HIGH (ref 0–37)
Albumin: 2.4 g/dL — ABNORMAL LOW (ref 3.5–5.2)
Alkaline Phosphatase: 53 U/L (ref 39–117)
CO2: 26 mEq/L (ref 19–32)
GFR calc Af Amer: 40 mL/min — ABNORMAL LOW (ref >90)
Glucose, Bld: 87 mg/dL (ref 70–99)
Potassium: 4.4 mEq/L (ref 3.5–5.1)
Sodium: 145 mEq/L (ref 135–145)
Total Bilirubin: 0.2 mg/dL — ABNORMAL LOW (ref 0.3–1.2)
Total Protein: 4.7 g/dL — ABNORMAL LOW (ref 6.0–8.3)

## 2013-03-07 LAB — GLUCOSE, CAPILLARY
Glucose-Capillary: 77 mg/dL (ref 70–99)
Glucose-Capillary: 93 mg/dL (ref 70–99)
Glucose-Capillary: 117 mg/dL — ABNORMAL HIGH (ref 70–99)

## 2013-03-07 MED ORDER — HALOPERIDOL LACTATE 5 MG/ML IJ SOLN
1.0000 mg | INTRAMUSCULAR | Status: DC | PRN
  Administered 2013-03-07 – 2013-03-08 (×4): 4 mg via INTRAVENOUS
  Filled 2013-03-07 (×3): qty 1

## 2013-03-07 MED ORDER — DEXTROSE 5 % IV SOLN
INTRAVENOUS | Status: DC
  Administered 2013-03-07: 12:00:00 via INTRAVENOUS

## 2013-03-07 MED ORDER — TRAZODONE HCL 100 MG PO TABS
100.0000 mg | ORAL_TABLET | Freq: Every day | ORAL | Status: DC
  Administered 2013-03-07 – 2013-03-12 (×6): 100 mg via ORAL
  Filled 2013-03-07 (×9): qty 1

## 2013-03-07 MED ORDER — INSULIN ASPART 100 UNIT/ML ~~LOC~~ SOLN: 0.0000 [IU] | Freq: Every day | SUBCUTANEOUS | Status: DC

## 2013-03-07 MED ORDER — LEVALBUTEROL HCL 0.63 MG/3ML IN NEBU
INHALATION_SOLUTION | RESPIRATORY_TRACT | Status: AC
  Filled 2013-03-07: qty 3

## 2013-03-07 MED ORDER — PIPERACILLIN-TAZOBACTAM 3.375 G IVPB
3.3750 g | Freq: Three times a day (TID) | INTRAVENOUS | Status: DC
  Administered 2013-03-08 – 2013-03-13 (×17): 3.375 g via INTRAVENOUS
  Filled 2013-03-07 (×23): qty 50

## 2013-03-07 MED ORDER — ALPRAZOLAM 0.5 MG PO TABS
1.0000 mg | ORAL_TABLET | Freq: Three times a day (TID) | ORAL | Status: DC | PRN
  Administered 2013-03-07 – 2013-03-12 (×7): 1 mg via ORAL
  Filled 2013-03-07 (×6): qty 2
  Filled 2013-03-07: qty 4
  Filled 2013-03-07: qty 2

## 2013-03-07 MED ORDER — INSULIN ASPART 100 UNIT/ML ~~LOC~~ SOLN
0.0000 [IU] | Freq: Three times a day (TID) | SUBCUTANEOUS | Status: DC
  Administered 2013-03-08 – 2013-03-10 (×2): 1 [IU] via SUBCUTANEOUS

## 2013-03-07 MED ORDER — MORPHINE SULFATE 2 MG/ML IJ SOLN
INTRAMUSCULAR | Status: AC
  Administered 2013-03-07: 2 mg via INTRAVENOUS
  Filled 2013-03-07: qty 1

## 2013-03-07 MED ORDER — METOPROLOL TARTRATE 1 MG/ML IV SOLN
2.5000 mg | INTRAVENOUS | Status: DC | PRN
  Administered 2013-03-08: 5 mg via INTRAVENOUS
  Filled 2013-03-07: qty 5

## 2013-03-07 MED ORDER — HALOPERIDOL LACTATE 5 MG/ML IJ SOLN
10.0000 mg | Freq: Once | INTRAMUSCULAR | Status: AC
  Administered 2013-03-07: 10 mg via INTRAVENOUS
  Filled 2013-03-07: qty 2

## 2013-03-07 MED ORDER — LEVALBUTEROL HCL 0.63 MG/3ML IN NEBU
0.6300 mg | INHALATION_SOLUTION | Freq: Four times a day (QID) | RESPIRATORY_TRACT | Status: DC
  Administered 2013-03-07 – 2013-03-13 (×21): 0.63 mg via RESPIRATORY_TRACT
  Filled 2013-03-07 (×29): qty 3

## 2013-03-07 MED ORDER — ENOXAPARIN SODIUM 30 MG/0.3ML ~~LOC~~ SOLN
30.0000 mg | SUBCUTANEOUS | Status: DC
  Administered 2013-03-07 – 2013-03-10 (×4): 30 mg via SUBCUTANEOUS
  Filled 2013-03-07 (×6): qty 0.3

## 2013-03-07 MED ORDER — HYDRALAZINE HCL 20 MG/ML IJ SOLN
10.0000 mg | INTRAMUSCULAR | Status: DC | PRN
  Administered 2013-03-08: 10 mg via INTRAVENOUS
  Administered 2013-03-09: 20 mg via INTRAVENOUS
  Filled 2013-03-07 (×2): qty 1

## 2013-03-07 MED ORDER — PANTOPRAZOLE SODIUM 40 MG IV SOLR
40.0000 mg | Freq: Every day | INTRAVENOUS | Status: DC
  Administered 2013-03-07: 40 mg via INTRAVENOUS
  Filled 2013-03-07 (×2): qty 40

## 2013-03-07 NOTE — Progress Notes (Signed)
Patient ID: Yolanda Medina, female   DOB: 1933/07/21, 79 y.o.   MRN: [id number]    Subjective: Pt pulled NGT out this morning.  Agree with leaving it out.  No nausea.  Hungry.  C/o some LLQ abdominal tenderness  Objective: Vital signs in last 24 hours: Temp:  [98 F (36.7 C)-100.9 F (38.3 C)] 98 F (36.7 C) (11/14 0400) Pulse Rate:  [95-120] 109 (11/14 0800) Resp:  [15-27] 18 (11/14 0800) BP: (96-151)/(49-92) 151/58 mmHg (11/14 0800) SpO2:  [93 %-100 %] 97 % (11/14 0800) Arterial Line BP: (103-160)/(28-60) 160/56 mmHg (11/14 0800) Weight:  [143 lb 11.8 oz (65.2 kg)] 143 lb 11.8 oz (65.2 kg) (11/14 0600)    Intake/Output from previous day: 11/13 0701 - 11/14 0700 In: 1964.1 [I.V.:1154.1; NG/GT:60; IV Piggyback:150] Out: 1660 [Urine:1510; Emesis/NG output:150] Intake/Output this shift: Total I/O In: -  Out: 75 [Urine:75]  PE: Abd: soft, mild LLQ tenderness, otherwise NT, ND, +BS  Lab Results:   Recent Labs  03/06/13 1440 03/07/13 0435  WBC 7.3 8.8  HGB 9.4* 8.5*  HCT 27.7* 26.0*  PLT 166 153   BMET  Recent Labs  03/06/13 0245 03/07/13 0435  NA 138 145  K 4.9 4.4  CL 102 112  CO2 24 26  GLUCOSE 122* 87  BUN 47* 40*  CREATININE 2.91* 1.42*  CALCIUM 6.8* 7.3*   PT/INR No results found for this basename: LABPROT, INR,  in the last 72 hours CMP     Component Value Date/Time   NA 145 03/07/2013 0435   K 4.4 03/07/2013 0435   CL 112 03/07/2013 0435   CO2 26 03/07/2013 0435   GLUCOSE 87 03/07/2013 0435   BUN 40* 03/07/2013 0435   CREATININE 1.42* 03/07/2013 0435   CALCIUM 7.3* 03/07/2013 0435   PROT 4.7* 03/07/2013 0435   ALBUMIN 2.4* 03/07/2013 0435   AST 47* 03/07/2013 0435   ALT 17 03/07/2013 0435   ALKPHOS 53 03/07/2013 0435   BILITOT 0.2* 03/07/2013 0435   GFRNONAA 35* 03/07/2013 0435   GFRAA 40* 03/07/2013 0435   Lipase     Component Value Date/Time   LIPASE 26 03/06/2013 0245       Studies/Results: Dg Chest Port 1  View  03/07/2013   CLINICAL DATA:  Respiratory failure  EXAM: PORTABLE CHEST - 1 VIEW  COMPARISON:  03/06/2013  FINDINGS: Cardiac shadow is stable. The nasogastric catheter is been removed in the interval. Mild vascular congestion is again seen. Persistent left lower lobe infiltrative changes are seen with associated effusion.  IMPRESSION: No change from the prior exam.   Electronically Signed   By: Alcide Clever M.D.   On: 03/07/2013 07:39   Dg Chest Port 1 View  03/06/2013   CLINICAL DATA:  Hypoxia.  EXAM: PORTABLE CHEST - 1 VIEW  COMPARISON:  None.  FINDINGS: The lungs are mildly hypoexpanded. Retrocardiac airspace opacification raises concern for pneumonia. Mild vascular crowding and vascular congestion are seen. A small left pleural effusion is suspected. No pneumothorax is identified.  The cardiomediastinal silhouette is borderline normal in size. Calcification is noted within the aortic arch. No acute osseous abnormalities are seen. The patient is status post left-sided rotator cuff repair. An enteric tube is noted ending overlying the antrum of the stomach.  IMPRESSION: Lungs mildly hypoexpanded. Retrocardiac airspace opacification raises concern for pneumonia; suspect small left pleural effusion. Mild vascular congestion seen.   Electronically Signed   By: Roanna Raider M.D.   On: 03/06/2013 02:43  Anti-infectives: Anti-infectives   Start     Dose/Rate Route Frequency Ordered Stop   03/06/13 0800  piperacillin-tazobactam (ZOSYN) IVPB 2.25 g     2.25 g 100 mL/hr over 30 Minutes Intravenous Every 8 hours 03/06/13 0256     03/06/13 0215  piperacillin-tazobactam (ZOSYN) IVPB 3.375 g     3.375 g 100 mL/hr over 30 Minutes Intravenous  Once 03/06/13 0204 03/06/13 0339       Assessment/Plan  1. Sepsis, improving 2. Portal Venous gas, unknown significance  3. LLQ abdominal tenderness Patient Active Problem List   Diagnosis Date Noted  . Septic shock(785.52) 03/06/2013  . AKI (acute  kidney injury) 03/06/2013  . Encephalopathy acute 03/06/2013  . Pneumobilia 03/06/2013   Plan: 1. The patient's NGT was pulled out.  Would not replace at this point.  She has no further nausea and vomiting.  She is hungry.   2. CT scan is unable to be located.  The report says nothing of concern about diverticulitis or any other process seen. 3. Suspect it may be ok to try clear liquids today.  Will D/W MD   LOS: 1 day    [name] 03/07/2013, 8:52 AM Pager: [phone]

## 2013-03-07 NOTE — Significant Event (Addendum)
Patient transferred to 2600, traveled via bed, on 2L [place], patient is settled in bed. Patient remained anxious and confused, oriented to self only, wanting to get "gas" or needing to do "garden". Patient is agitated at times today, but has been able to be resettled after reassurance and education.  Bed-alarm was turned on. All patient's belongings at bedside of 2617, receiving staff made aware. Patient's ex-spouse and sons made aware of the transfer. MD Simond was made aware of patient's anxiety and agitation. New orders received.   RN remained with patient until she was settled and more calmer. Report given to receiving RN, Eber Jones; all questions answered. Receiving RN did not have any other questions prior to RN leaving.  Barbarita Hutmacher, Charity fundraiser.

## 2013-03-07 NOTE — Progress Notes (Signed)
ANTIBIOTIC CONSULT NOTE - FOLLOW UP  Pharmacy Consult:  Zosyn Indication:  Empiric for aspiration PNA + intra-abdominal process  Allergies  Allergen Reactions  . Acetaminophen   . Ciprofloxacin   . Codeine     Patient Measurements: Height: 5\' 3"  (160 cm) Weight: 143 lb 11.8 oz (65.2 kg) IBW/kg (Calculated) : 52.4  Vital Signs: Temp: 98 F (36.7 C) (11/14 0400) Temp src: Oral (11/14 0400) BP: 151/58 mmHg (11/14 0800) Pulse Rate: 109 (11/14 0800) Intake/Output from previous day: 11/13 0701 - 11/14 0700 In: 1964.1 [I.V.:1154.1; NG/GT:60; IV Piggyback:150] Out: 1660 [Urine:1510; Emesis/NG output:150] Intake/Output from this shift: Total I/O In: -  Out: 75 [Urine:75]  Labs:  Recent Labs  03/06/13 0245 03/06/13 1440 03/07/13 0435  WBC 11.8* 7.3 8.8  HGB 9.6* 9.4* 8.5*  PLT 221 166 153  CREATININE 2.91*  --  1.42*   Estimated Creatinine Clearance: 30.1 ml/min (by C-G formula based on Cr of 1.42). No results found for this basename: VANCOTROUGH, Leodis Binet, VANCORANDOM, GENTTROUGH, GENTPEAK, GENTRANDOM, TOBRATROUGH, TOBRAPEAK, TOBRARND, AMIKACINPEAK, AMIKACINTROU, AMIKACIN,  in the last 72 hours   Microbiology: Recent Results (from the past 720 hour(s))  MRSA PCR SCREENING     Status: None   Collection Time    03/06/13  1:44 AM      Result Value Range Status   MRSA by PCR NEGATIVE  NEGATIVE Final   Comment:            The GeneXpert MRSA Assay (FDA     approved for NASAL specimens     only), is one component of a     comprehensive MRSA colonization     surveillance program. It is not     intended to diagnose MRSA     infection nor to guide or     monitor treatment for     MRSA infections.  CULTURE, BLOOD (ROUTINE X 2)     Status: None   Collection Time    03/06/13  2:55 AM      Result Value Range Status   Specimen Description BLOOD LEFT ARM   Final   Special Requests BOTTLES DRAWN AEROBIC ONLY [facility]   Final   Culture  Setup Time     Final   Value: 03/06/2013  09:41     Performed at [facility]   Culture     Final   Value:        BLOOD CULTURE RECEIVED NO GROWTH TO DATE CULTURE WILL BE HELD FOR 5 DAYS BEFORE ISSUING A FINAL NEGATIVE REPORT     Performed at [facility]   Report Status PENDING   Incomplete  CULTURE, BLOOD (ROUTINE X 2)     Status: None   Collection Time    03/06/13  5:00 AM      Result Value Range Status   Specimen Description BLOOD RIGHT RADIAL A-LINE   Final   Special Requests BOTTLES DRAWN AEROBIC AND ANAEROBIC 10CC EACH   Final   Culture  Setup Time     Final   Value: 03/06/2013 11:25     Performed at [facility]   Culture     Final   Value:        BLOOD CULTURE RECEIVED NO GROWTH TO DATE CULTURE WILL BE HELD FOR 5 DAYS BEFORE ISSUING A FINAL NEGATIVE REPORT     Performed at [facility]   Report Status PENDING   Incomplete      Assessment: 79 y/o  female who lives in a SNF was admitted on 03/06/13 on transfer from the [place] ED for confusion and a possible GI bleed.  Patient continues on Zosyn for aspiration PNA and possible abdominal sepsis.  Patient's renal function improving.  11/13 bld x 2 - NGTD 11/13 Urine - collected 11/13 MRSA PCR - negative   Goal of Therapy:  Infection prevention / clearance of infection   Plan:  - Change Zosyn to 3.375gm IV Q8H, 4 hr infusion - Monitor renal fxn, clinical course    Oryn Casanova D. Laney Potash, PharmD, BCPS Pager:  [phone] 03/07/2013, 10:03 AM

## 2013-03-07 NOTE — Progress Notes (Signed)
PULMONARY  / CRITICAL CARE MEDICINE  Name: Yolanda Medina MRN: [id number] DOB: [birth year]    ADMISSION DATE:  03/06/2013 CONSULTATION DATE:  03/06/2013  REFERRING MD :  [facility]H ED PRIMARY SERVICE: PCCM  CHIEF COMPLAINT:  Confusion  BRIEF PATIENT DESCRIPTION: 79 y/o female with dementia who lives in a SNF was admitted on 11/13 in the early AM on transfer from the [place] ED for confusion and a possible GI bleed.  SIGNIFICANT EVENTS / STUDIES:  11/12 CT head [facility]via):  NAICP 11/12 CT abdomen:  no free air, small amount of portal venous gas 11/13 Off vasopressors.  11/14 Poorly oriented. No distress. Transfer to SDU. TRH to assume care as of AM 11/15   LINES / TUBES: Art line 11/12 >> 11/14 L fem CVL 11/12 >> 11/14  MICRO: PCT 11/13: 10.13 urine 11/13 >> greater than 100k E coli blood 11/13 >>    ANTIBIOTICS: Pip-tazo 11/13 >>    SUBJECTIVE:  No distress. Off pressors. + F/C. Poorly oriented. Intermittently agitated   VITAL SIGNS: Temp:  [97.9 F (36.6 C)-100.9 F (38.3 C)] 97.9 F (36.6 C) (11/14 1133) Pulse Rate:  [103-129] 129 (11/14 1400) Resp:  [15-26] 20 (11/14 1400) BP: (96-174)/(49-80) 174/80 mmHg (11/14 1400) SpO2:  [90 %-99 %] 90 % (11/14 1400) Arterial Line BP: (107-160)/(34-60) 159/59 mmHg (11/14 0900) Weight:  [65.2 kg (143 lb 11.8 oz)] 65.2 kg (143 lb 11.8 oz) (11/14 0600) HEMODYNAMICS:   VENTILATOR SETTINGS:   INTAKE / OUTPUT: Intake/Output     11/13 0701 - 11/14 0700 11/14 0701 - 11/15 0700   P.O.  120   I.V. (mL/kg) 1154.1 (17.7) 270.2 (4.1)   Other 600    NG/GT 60    IV Piggyback 150 50   Total Intake(mL/kg) 1964.1 (30.1) 440.2 (6.8)   Urine (mL/kg/hr) 1510 (1) 585 (1)   Emesis/NG output 150 (0.1)    Total Output 1660 585   Net +304.1 -144.8          PHYSICAL EXAMINATION: General:  Chronically ill appearing, confused Neuro:  No focal deficits HEENT: WNL Cardiovascular:  RRR, + syst M Lungs:  Clear Abdomen: soft, NT,  NABS Ext: no edema, warm   LABS: I have reviewed all of today's lab results. Relevant abnormalities are discussed in the A/P section   CXR: LLL Atx    ASSESSMENT / PLAN:  INFECTIOUS A:  Severe sepsis, abdominal source (portal venous gas) vs UTI (bacteruria) P:   Micro and abx as above   GASTROINTESTINAL A:   Nausea and vomiting Portal venous gas, unclear etiology P:   SUP: IV PPI CCS following Advance diet as tolerated  CARDIOVASCULAR A: Septic shock, resolved Hypovolemic shock, resolved Hypertension, tachycardia P:  Monitor PRN hydralazine, metoprolol Transfer to SDU  PULMONARY A: Mild hypoxemia Possible aspiration  P:   Supplemental O2 to maintain SpO2 > 90% Monitor CXR intermittently  RENAL A:  AKI Mild AG acidosis, resolved Oliguria, resolved P:   Monitor BMET intermittently Correct electrolytes as indicated Monitor I/Os  HEMATOLOGIC A:   Mild anemia without acute blood loss P:  Monitor CBC intermittently Transfuse for acute blood loss or Hgb < 7.0  ENDOCRINE A:  DM2 Episodic hypoglycemia P:   -ICU hyperglycemia protocol  NEUROLOGIC A: Acute encephalopathy Agitation Dementia  Chronic insomnia (deduced from home med list) Polypharmacy with multiple psychotropics P:   PRN Haldol ordered PRN Xanax (takes chronically) PRN trazodone for sleep Minimize sedating meds   TODAY'S SUMMARY:  Transfer to SDU.  TRH to assume care as of AM 11/15 and PCCM to sign off  Sharlot Gowda Pulmonary and Critical Care Medicine [facility] Pager: [phone]  03/07/2013, 3:48 PM

## 2013-03-07 NOTE — Progress Notes (Signed)
Pt  noted to be anxious and restless. Hr 144. 02  02 sat 88-92%. Dr. Delford Field notified and orders for Haldol 10mg  iv and a xopenex treatment.

## 2013-03-07 NOTE — Progress Notes (Signed)
eLink Physician-Brief Progress Note Patient Name: Yolanda Medina DOB: 12/03/1933 MRN: [id number]  Date of Service  03/07/2013   HPI/Events of Note   Pt acutely agitated, in non camera SDU bed.  eICU Interventions  Give one dose Haldol, give xopenex Low threshold to return to ICU   Intervention Category Major Interventions: Delirium, psychosis, severe agitation - evaluation and management  Shan Levans 03/07/2013, 4:43 PM

## 2013-03-07 NOTE — Progress Notes (Signed)
Resting quietly since medicated for anxiety. Transfer orders dced.

## 2013-03-07 NOTE — Progress Notes (Signed)
Follow clinically for now.  Adv diet D/C NGT

## 2013-03-07 NOTE — Progress Notes (Signed)
02 increased to 6 liters per resp. Therapy.

## 2013-03-08 ENCOUNTER — Inpatient Hospital Stay (HOSPITAL_COMMUNITY): Payer: Medicare Other

## 2013-03-08 ENCOUNTER — Other Ambulatory Visit (HOSPITAL_COMMUNITY): Payer: Medicare Other

## 2013-03-08 LAB — BLOOD GAS, ARTERIAL
Acid-Base Excess: 2.7 mmol/L — ABNORMAL HIGH (ref 0.0–2.0)
Bicarbonate: 25.4 mEq/L — ABNORMAL HIGH (ref 20.0–24.0)
Drawn by: 347621
FIO2: 0.5 %
O2 Saturation: 96.6 %
Patient temperature: 100.3
TCO2: 26.3 mmol/L (ref 0–100)
pCO2 arterial: 31.6 mmHg — ABNORMAL LOW (ref 35.0–45.0)
pH, Arterial: 7.52 — ABNORMAL HIGH (ref 7.350–7.450)
pO2, Arterial: 83.7 mmHg (ref 80.0–100.0)

## 2013-03-08 LAB — BASIC METABOLIC PANEL
BUN: 27 mg/dL — ABNORMAL HIGH (ref 6–23)
CO2: 26 mEq/L (ref 19–32)
Calcium: 8.5 mg/dL (ref 8.4–10.5)
Chloride: 108 mEq/L (ref 96–112)
Creatinine, Ser: 0.73 mg/dL (ref 0.50–1.10)
GFR calc Af Amer: >90 mL/min (ref >90)
GFR calc non Af Amer: 80 mL/min — ABNORMAL LOW (ref >90)
Glucose, Bld: 105 mg/dL — ABNORMAL HIGH (ref 70–99)
Potassium: 4 mEq/L (ref 3.5–5.1)
Sodium: 141 mEq/L (ref 135–145)

## 2013-03-08 LAB — URINE CULTURE: Culture: >=100000

## 2013-03-08 LAB — CBC
HCT: 28.4 % — ABNORMAL LOW (ref 36.0–46.0)
MCH: 31.2 pg (ref 26.0–34.0)
MCHC: 33.8 g/dL (ref 30.0–36.0)
MCV: 92.2 fL (ref 78.0–100.0)
Platelets: 159 10*3/uL (ref 150–400)
RBC: 3.08 MIL/uL — ABNORMAL LOW (ref 3.87–5.11)
RDW: 14.4 % (ref 11.5–15.5)

## 2013-03-08 LAB — GLUCOSE, CAPILLARY
Glucose-Capillary: 128 mg/dL — ABNORMAL HIGH (ref 70–99)
Glucose-Capillary: 115 mg/dL — ABNORMAL HIGH (ref 70–99)
Glucose-Capillary: 123 mg/dL — ABNORMAL HIGH (ref 70–99)

## 2013-03-08 MED ORDER — PANTOPRAZOLE SODIUM 40 MG PO TBEC
40.0000 mg | DELAYED_RELEASE_TABLET | Freq: Every day | ORAL | Status: DC
  Administered 2013-03-09 – 2013-03-13 (×5): 40 mg via ORAL
  Filled 2013-03-08 (×5): qty 1

## 2013-03-08 MED ORDER — IBUPROFEN 400 MG PO TABS
400.0000 mg | ORAL_TABLET | Freq: Once | ORAL | Status: AC
  Administered 2013-03-08: 400 mg via ORAL
  Filled 2013-03-08: qty 1

## 2013-03-08 MED ORDER — LEVALBUTEROL HCL 0.63 MG/3ML IN NEBU
0.6300 mg | INHALATION_SOLUTION | RESPIRATORY_TRACT | Status: DC | PRN
  Administered 2013-03-08: 0.63 mg via RESPIRATORY_TRACT

## 2013-03-08 MED ORDER — LABETALOL HCL 100 MG PO TABS
100.0000 mg | ORAL_TABLET | Freq: Two times a day (BID) | ORAL | Status: DC
  Administered 2013-03-08 – 2013-03-13 (×10): 100 mg via ORAL
  Filled 2013-03-08 (×13): qty 1

## 2013-03-08 MED ORDER — HALOPERIDOL LACTATE 5 MG/ML IJ SOLN
1.0000 mg | INTRAMUSCULAR | Status: DC | PRN
  Administered 2013-03-08 – 2013-03-10 (×3): 1 mg via INTRAVENOUS
  Filled 2013-03-08 (×3): qty 1

## 2013-03-08 NOTE — Progress Notes (Signed)
[facility] Progress Note [place] TEAM 1 - Stepdown/ICU TEAM   Yolanda Medina WUJ:[id number] DOB: 06/09/1933 DOA: 03/06/2013 PCP: Provider Not In System  Admit HPI / Brief Narrative: 79 y/o female with dementia who lives in a SNF was admitted on 11/13 in the early AM on transfer from the [place] ED for confusion and a possible GI bleed. She was confused at presentation and no family was available so history was obtained by chart review. It appeared she came in from her nursing home on 11/12 confused, hypoxemic and developed shock. In the ED she vomited "coffee grounds" which were heme positive. She was found to be in acute renal failure  SIGNIFICANT EVENTS / STUDIES:  11/12 CT head [facility]): NAICP  11/12 CT abdomen: no free air, small amount of portal venous gas  11/13 Off vasopressors.  11/14 Poorly oriented. No distress. Transfer to SDU.  11/15 TRH assumed care   Assessment/Plan:  Severe sepsis - abdominal source versus UTI BP stable - remains tachycardic, and tachypneic, but WBC is normal and pt is afebrile   Portal venous gas Etiology unclear  E coli UTI / pyelonephritis  Multi drug resistant - rocephin sensitive   HTN Poorly controlled in setting of agitation - tx agitation first and follow trend  Tachycardia in setting of agitation - tx agitation first and follow trend  Possible aspiration with mild hypoxemia Wean O2 down as able   Acute kidney injury - resolved crt has normalized  Mild anemia without acute blood loss Hgb is improving - no evidence of acute blood loss  DM2 CBG well controlled  Acute encephalopathy / Agitation / Baseline Dementia / polypharmacy  Code Status: FULL Family Communication: no family present at time of exam Disposition Plan: SDU  Consultants: PCCM >> [facility] Surgery  Procedures: none  Antibiotics: Zosyn 11/12 >>   DVT prophylaxis: lovenox  HPI/Subjective: The patient is heavily sedated at time of visit.   She is not able to ride any history.  She does not appear to be in acute distress.  Objective: Blood pressure 162/53, pulse 109, temperature 98.7 F (37.1 C), temperature source Oral, resp. rate 25, height 5\' 3"  (1.6 m), weight 64.6 kg (142 lb 6.7 oz), SpO2 98.00%.  Intake/Output Summary (Last 24 hours) at 03/08/13 1634 Last data filed at 03/08/13 1200  Gross per 24 hour  Intake   1195 ml  Output    700 ml  Net    495 ml    Exam: General: No acute respiratory distress Lungs: Clear to auscultation bilaterally without wheezes or crackles Cardiovascular: Regular rhythm but tachycardic without murmur gallop or rub normal S1 and S2 Abdomen: Nontender, nondistended, soft, bowel sounds positive, no rebound, no ascites, no appreciable mass Extremities: No significant cyanosis, clubbing, or edema bilateral lower extremities  Data Reviewed: Basic Metabolic Panel:  Recent Labs Lab 03/06/13 0245 03/07/13 0435 03/08/13 0325  NA 138 145 141  K 4.9 4.4 4.0  CL 102 112 108  CO2 24 26 26   GLUCOSE 122* 87 105*  BUN 47* 40* 27*  CREATININE 2.91* 1.42* 0.73  CALCIUM 6.8* 7.3* 8.5  MG 1.6  --   --   PHOS 6.5*  --   --    Liver Function Tests:  Recent Labs Lab 03/06/13 0245 03/07/13 0435  AST 56* 47*  ALT 17 17  ALKPHOS 51 53  BILITOT 0.2* 0.2*  PROT 5.1* 4.7*  ALBUMIN 2.8* 2.4*    Recent Labs Lab 03/06/13 0245  LIPASE 26  AMYLASE 192*   CBC:  Recent Labs Lab 03/06/13 0245 03/06/13 1440 03/07/13 0435 03/08/13 0325  WBC 11.8* 7.3 8.8 9.9  NEUTROABS 10.0*  --   --   --   HGB 9.6* 9.4* 8.5* 9.6*  HCT 29.7* 27.7* 26.0* 28.4*  MCV 94.6 93.0 93.2 92.2  PLT 221 166 153 159   Cardiac Enzymes:  Recent Labs Lab 03/06/13 0201 03/06/13 0810 03/06/13 1440  TROPONINI <0.30 <0.30 <0.30   BNP (last 3 results)  Recent Labs  03/06/13 0201  PROBNP 788.5*   CBG:  Recent Labs Lab 03/07/13 0817 03/07/13 1130 03/07/13 2200 03/08/13 0743 03/08/13 1044  GLUCAP 77  93 117* 104* 128*    Recent Results (from the past 240 hour(s))  MRSA PCR SCREENING     Status: None   Collection Time    03/06/13  1:44 AM      Result Value Range Status   MRSA by PCR NEGATIVE  NEGATIVE Final   Comment:            The GeneXpert MRSA Assay (FDA     approved for NASAL specimens     only), is one component of a     comprehensive MRSA colonization     surveillance program. It is not     intended to diagnose MRSA     infection nor to guide or     monitor treatment for     MRSA infections.  CULTURE, BLOOD (ROUTINE X 2)     Status: None   Collection Time    03/06/13  2:55 AM      Result Value Range Status   Specimen Description BLOOD LEFT ARM   Final   Special Requests BOTTLES DRAWN AEROBIC ONLY [facility]   Final   Culture  Setup Time     Final   Value: 03/06/2013 09:41     Performed at [facility]   Culture     Final   Value:        BLOOD CULTURE RECEIVED NO GROWTH TO DATE CULTURE WILL BE HELD FOR 5 DAYS BEFORE ISSUING A FINAL NEGATIVE REPORT     Performed at [facility]   Report Status PENDING   Incomplete  CULTURE, BLOOD (ROUTINE X 2)     Status: None   Collection Time    03/06/13  5:00 AM      Result Value Range Status   Specimen Description BLOOD RIGHT RADIAL A-LINE   Final   Special Requests BOTTLES DRAWN AEROBIC AND ANAEROBIC 10CC EACH   Final   Culture  Setup Time     Final   Value: 03/06/2013 11:25     Performed at [facility]   Culture     Final   Value:        BLOOD CULTURE RECEIVED NO GROWTH TO DATE CULTURE WILL BE HELD FOR 5 DAYS BEFORE ISSUING A FINAL NEGATIVE REPORT     Performed at [facility]   Report Status PENDING   Incomplete  URINE CULTURE     Status: None   Collection Time    03/06/13  5:41 AM      Result Value Range Status   Specimen Description URINE, CATHETERIZED   Final   Special Requests Normal   Final   Culture  Setup Time     Final   Value: 03/06/2013 18:21     Performed at [facility]  Final   Value: >=100,000 COLONIES/mL ESCHERICHIA COLI     Performed at [facility]   Report Status 03/08/2013 FINAL   Final   Organism ID, Bacteria ESCHERICHIA COLI   Final     Studies:  Recent x-ray studies have been reviewed in detail by the Attending Physician  Scheduled Meds:  Scheduled Meds: . enoxaparin (LOVENOX) injection  30 mg Subcutaneous Q24H  . insulin aspart  0-5 Units Subcutaneous QHS  . insulin aspart  0-9 Units Subcutaneous TID WC  . levalbuterol  0.63 mg Nebulization Q6H  . pantoprazole (PROTONIX) IV  40 mg Intravenous QHS  . piperacillin-tazobactam (ZOSYN)  IV  3.375 g Intravenous Q8H  . traZODone  100 mg Oral QHS    Time spent on care of this patient: 35 mins   [facility] T  [facility] Office  [phone] Pager - Text Page per Loretha Stapler as per below:  On-Call/Text Page:      [email or web address]      password [email or web address]  If 7PM-7AM, please contact night-coverage [email or web address] Password [email or web address] 03/08/2013, 4:34 PM   LOS: 2 days

## 2013-03-08 NOTE — Significant Event (Signed)
Rapid Response Event Note  Overview:      Initial Focused Assessment:   Interventions:   Event Summary: RR 22, pt. Resting on BiPap Name of Physician Notified: Benedetto Coons NP at 2105    at    Outcome: Stayed in room and stabalized  Event End Time: 2235  Mallie Darting

## 2013-03-08 NOTE — Progress Notes (Signed)
Patient ID: Yolanda Medina, female   DOB: 30-Oct-1933, 79 y.o.   MRN: [id number]    Subjective: Pt c/o "pain all over" her abdomen when asked.  RN states patient has been agitated over night and received ativan and haldol.  Tolerated clears, no nausea  Objective: Vital signs in last 24 hours: Temp:  [97.4 F (36.3 C)-98.7 F (37.1 C)] 98.7 F (37.1 C) (11/15 0448) Pulse Rate:  [82-130] 114 (11/15 0800) Resp:  [13-26] 20 (11/15 0800) BP: (108-182)/(42-98) 171/61 mmHg (11/15 0800) SpO2:  [90 %-100 %] 95 % (11/15 0800) Arterial Line BP: (159)/(59) 159/59 mmHg (11/14 0900) Weight:  [142 lb 6.7 oz (64.6 kg)] 142 lb 6.7 oz (64.6 kg) (11/14 1616)    Intake/Output from previous day: 11/14 0701 - 11/15 0700 In: 580.2 [P.O.:120; I.V.:410.2; IV Piggyback:50] Out: 1405 [Urine:1405] Intake/Output this shift:    PE: Abd: soft, grimaces with palpation, but no guarding, hypoactive BS, ND Heart: tachy Lungs: Exp wheeze  Lab Results:   Recent Labs  03/07/13 0435 03/08/13 0325  WBC 8.8 9.9  HGB 8.5* 9.6*  HCT 26.0* 28.4*  PLT 153 159   BMET  Recent Labs  03/07/13 0435 03/08/13 0325  NA 145 141  K 4.4 4.0  CL 112 108  CO2 26 26  GLUCOSE 87 105*  BUN 40* 27*  CREATININE 1.42* 0.73  CALCIUM 7.3* 8.5   PT/INR No results found for this basename: LABPROT, INR,  in the last 72 hours CMP     Component Value Date/Time   NA 141 03/08/2013 0325   K 4.0 03/08/2013 0325   CL 108 03/08/2013 0325   CO2 26 03/08/2013 0325   GLUCOSE 105* 03/08/2013 0325   BUN 27* 03/08/2013 0325   CREATININE 0.73 03/08/2013 0325   CALCIUM 8.5 03/08/2013 0325   PROT 4.7* 03/07/2013 0435   ALBUMIN 2.4* 03/07/2013 0435   AST 47* 03/07/2013 0435   ALT 17 03/07/2013 0435   ALKPHOS 53 03/07/2013 0435   BILITOT 0.2* 03/07/2013 0435   GFRNONAA 80* 03/08/2013 0325   GFRAA >90 03/08/2013 0325   Lipase     Component Value Date/Time   LIPASE 26 03/06/2013 0245       Studies/Results: Dg Chest  Port 1 View  03/07/2013   CLINICAL DATA:  Respiratory failure  EXAM: PORTABLE CHEST - 1 VIEW  COMPARISON:  03/06/2013  FINDINGS: Cardiac shadow is stable. The nasogastric catheter is been removed in the interval. Mild vascular congestion is again seen. Persistent left lower lobe infiltrative changes are seen with associated effusion.  IMPRESSION: No change from the prior exam.   Electronically Signed   By: Alcide Clever M.D.   On: 03/07/2013 07:39    Anti-infectives: Anti-infectives   Start     Dose/Rate Route Frequency Ordered Stop   03/07/13 1600  piperacillin-tazobactam (ZOSYN) IVPB 3.375 g     3.375 g 12.5 mL/hr over 240 Minutes Intravenous Every 8 hours 03/07/13 1005     03/06/13 0800  piperacillin-tazobactam (ZOSYN) IVPB 2.25 g  Status:  Discontinued     2.25 g 100 mL/hr over 30 Minutes Intravenous Every 8 hours 03/06/13 0256 03/07/13 1004   03/06/13 0215  piperacillin-tazobactam (ZOSYN) IVPB 3.375 g     3.375 g 100 mL/hr over 30 Minutes Intravenous  Once 03/06/13 0204 03/06/13 0339       Assessment/Plan  1. Portal venous gas on CT 2. Dementia Patient Active Problem List   Diagnosis Date Noted  . UTI (lower  urinary tract infection) 03/07/2013  . Septic shock(785.52) 03/06/2013  . AKI (acute kidney injury) 03/06/2013  . Encephalopathy acute 03/06/2013  . Pneumobilia 03/06/2013   Plan: 1. Will get a port KUB today to follow up on her abdominal exam.  Her exam seems benign, but she grimaces with palpation.  Unclear if this is secondary to her dementia, but given CT findings would like to have an xray follow up. 2. Cont clears for now   LOS: 2 days    Chaise Passarella E 03/08/2013, 8:20 AM Pager: [phone]

## 2013-03-08 NOTE — Significant Event (Addendum)
Rapid Response Event Note  Overview: called by bedside RN, pt. Tachypnic, tachycardic, T - 100.3, stable BP. Pt. Already had resp tx and Haldol for on-going anxiety       Initial Focused Assessment:  pt. With dementia in resp. Distress and bil. Exp. Wheezing. O2 sat 93% on 3L, color pink. Ibuprofen given for temp.   Interventions: O2 inc. To 50% VM with sat improved to 96%. Additional resp. tx given with little improvement of distress. ABG, CxR and BiPap ordered as per Benedetto Coons NP.      Event Summary: orders initiated. On BiPap O2 sat 98% and RR in 20's   at      at          [facility], Everlena Cooper

## 2013-03-08 NOTE — Progress Notes (Signed)
May need repeat CT to address any changes.   Check KUB.  Exam a little difficult given dementia

## 2013-03-08 NOTE — Progress Notes (Addendum)
[facility] progress note. Chief complaint. Dyspnea, tachypnea, wheezing. History of present illness. This 79 year old female in hospital with severe sepsis abdominal versus urinary tract source. Per today's progress note the patient noted with possible aspiration and in mild hypoxemia stable on nasal cannula oxygen. Patient developed acute onset of a wheezing, dyspnea, tachypnea and rapid response was called to the bedside. I was notified and also into the bedside to evaluate the patient. Given her increase work of breathing that it was reasonable to initiated on BiPAP therapy which was done. This is totally resolved patient wheezing and respiratory distress now appears resolved with a respiratory rate now in the 20s. Question aspiration is a possible etiology of this acute event. Patient is demented and unable to provide any type of reasonable history or review of systems. Vital signs. Temperature 100.3, pulse 120, respiration mid 20s, blood pressure 160/62. O2 sats 98%. General appearance. Frail elderly female who is alert but nonverbal. No evidence of distress now on BiPAP support. Cardiac. Regular rhythm, tachycardic rate. Lungs. Patient vocalizing making auscultation difficult but lung fields appear clear without evidence of crackles or wheezing currently. No evidence of distress now on BiPAP. Abdomen. Soft with positive bowel sounds. Extremities. No peripheral edema in the feet. No calf pain and negative Homans. Impression/plan. Problem #1. Respiratory distress. Suspect a possible aspiration event. An arterial blood gas was obtained at the same time BiPAP was initiated showing pH 7.52, PCO2 31.6, PO2 83.7, bicarbonate 25.4. A chest x-ray is ordered but results are pending currently. Patient continues on Zosyn antibiotic therapy but if pneumonia indicated per chest x-ray would consider adding vancomycin. We'll follow for these results. We'll also repeat an arterial blood gas and 4 hours to  monitor gas exchange and pH. Nursing will update me as needed.  Addendum: Chest x-ray resulted vascular congestion and mild cardiomegaly with bibasilar airspace opacities that may reflect worsening pulmonary edema or possibly pneumonia. Given the possibility of aspiration and pneumonia I widenen the spectrum of current antibiotics by adding vancomycin per pharmacy consult. Clinical exam did not suggest volume overload and I will defer diuretics at this time.

## 2013-03-09 LAB — BLOOD GAS, ARTERIAL
Drawn by: 347621
Expiratory PAP: 5
Inspiratory PAP: 14
O2 Saturation: 98.9 %
pCO2 arterial: 38.7 mmHg (ref 35.0–45.0)
pH, Arterial: 7.466 — ABNORMAL HIGH (ref 7.350–7.450)
pO2, Arterial: 132 mmHg — ABNORMAL HIGH (ref 80.0–100.0)

## 2013-03-09 LAB — GLUCOSE, CAPILLARY
Glucose-Capillary: 98 mg/dL (ref 70–99)
Glucose-Capillary: 101 mg/dL — ABNORMAL HIGH (ref 70–99)
Glucose-Capillary: 93 mg/dL (ref 70–99)

## 2013-03-09 MED ORDER — VANCOMYCIN HCL 500 MG IV SOLR
500.0000 mg | Freq: Two times a day (BID) | INTRAVENOUS | Status: DC
  Administered 2013-03-09 – 2013-03-10 (×4): 500 mg via INTRAVENOUS
  Filled 2013-03-09 (×6): qty 500

## 2013-03-09 NOTE — Progress Notes (Signed)
ANTIBIOTIC CONSULT NOTE - INITIAL  Pharmacy Consult for Vancomycin per Rx (Zosyn per MD) Indication: rule out pneumonia  Allergies  Allergen Reactions  . Acetaminophen Other (See Comments)    REACTION: unknown  . Ciprofloxacin Other (See Comments)    REACTION: unknown  . Codeine Other (See Comments)    REACTION:  unknown   Patient Measurements: Height: 5\' 3"  (160 cm) Weight: 142 lb 6.7 oz (64.6 kg) IBW/kg (Calculated) : 52.4  Vital Signs: Temp: 98.9 F (37.2 C) (11/16 0013) Temp src: Axillary (11/16 0013) BP: 164/68 mmHg (11/16 0035) Pulse Rate: 88 (11/16 0035) Intake/Output from previous day: 11/15 0701 - 11/16 0700 In: 1350 [P.O.:360; I.V.:840; IV Piggyback:150] Out: 1050 [Urine:1050] Intake/Output from this shift: Total I/O In: -  Out: 500 [Urine:500]  Labs:  Recent Labs  03/06/13 0245 03/06/13 1440 03/07/13 0435 03/08/13 0325  WBC 11.8* 7.3 8.8 9.9  HGB 9.6* 9.4* 8.5* 9.6*  PLT 221 166 153 159  CREATININE 2.91*  --  1.42* 0.73   Estimated Creatinine Clearance: 53.3 ml/min (by C-G formula based on Cr of 0.73). No results found for this basename: VANCOTROUGH, Leodis Binet, VANCORANDOM, GENTTROUGH, GENTPEAK, GENTRANDOM, TOBRATROUGH, TOBRAPEAK, TOBRARND, AMIKACINPEAK, AMIKACINTROU, AMIKACIN,  in the last 72 hours   Microbiology: Recent Results (from the past 720 hour(s))  MRSA PCR SCREENING     Status: None   Collection Time    03/06/13  1:44 AM      Result Value Range Status   MRSA by PCR NEGATIVE  NEGATIVE Final   Comment:            The GeneXpert MRSA Assay (FDA     approved for NASAL specimens     only), is one component of a     comprehensive MRSA colonization     surveillance program. It is not     intended to diagnose MRSA     infection nor to guide or     monitor treatment for     MRSA infections.  CULTURE, BLOOD (ROUTINE X 2)     Status: None   Collection Time    03/06/13  2:55 AM      Result Value Range Status   Specimen Description BLOOD  LEFT ARM   Final   Special Requests BOTTLES DRAWN AEROBIC ONLY [facility]   Final   Culture  Setup Time     Final   Value: 03/06/2013 09:41     Performed at [facility]   Culture     Final   Value:        BLOOD CULTURE RECEIVED NO GROWTH TO DATE CULTURE WILL BE HELD FOR 5 DAYS BEFORE ISSUING A FINAL NEGATIVE REPORT     Performed at [facility]   Report Status PENDING   Incomplete  CULTURE, BLOOD (ROUTINE X 2)     Status: None   Collection Time    03/06/13  5:00 AM      Result Value Range Status   Specimen Description BLOOD RIGHT RADIAL A-LINE   Final   Special Requests BOTTLES DRAWN AEROBIC AND ANAEROBIC 10CC EACH   Final   Culture  Setup Time     Final   Value: 03/06/2013 11:25     Performed at [facility]   Culture     Final   Value:        BLOOD CULTURE RECEIVED NO GROWTH TO DATE CULTURE WILL BE HELD FOR 5 DAYS BEFORE ISSUING A FINAL NEGATIVE REPORT  Performed at [facility]   Report Status PENDING   Incomplete  URINE CULTURE     Status: None   Collection Time    03/06/13  5:41 AM      Result Value Range Status   Specimen Description URINE, CATHETERIZED   Final   Special Requests Normal   Final   Culture  Setup Time     Final   Value: 03/06/2013 18:21     Performed at [facility]   Culture     Final   Value: >=100,000 COLONIES/mL ESCHERICHIA COLI     Performed at [facility]   Report Status 03/08/2013 FINAL   Final   Organism ID, Bacteria ESCHERICHIA COLI   Final    Medical History: Past Medical History  Diagnosis Date  . Dementia   . Anxiety   . Paranoia   . Hypertension   . Hypercholesterolemia   . Diabetes mellitus    Assessment: 79 y/o F to broaden antibiotic coverage for PNA. WBC wnl, Tmax 100.3, CrCl ~50.   11/16 Vanco>> 11/13 Zosyn>>  Goal of Therapy:  Vancomycin trough level 15-20 mcg/ml  Plan:  -Vancomycin 500 mg IV q12h -Zosyn per MD  -Trend WBC, temp, renal function  -Drug levels as  indicated  Thank you for allowing me to take part in this patient's care,  Abran Duke, PharmD Clinical Pharmacist Phone: [phone] Pager: [phone] 03/09/2013 1:34 AM

## 2013-03-09 NOTE — Progress Notes (Signed)
Abdomen benign, she is asking why she is in the hospital. Patient examined and I agree with the assessment and plan  Yolanda Gelinas, MD, MPH, FACS Pager: [phone]  03/09/2013 1:11 PM

## 2013-03-09 NOTE — Progress Notes (Signed)
Patient ID: Yolanda Medina, female   DOB: Dec 05, 1933, 79 y.o.   MRN: [id number]    Subjective: Pt sleeping.  She didn't awake for me this morning.  cpap in place  Objective: Vital signs in last 24 hours: Temp:  [98 F (36.7 C)-100.3 F (37.9 C)] 98 F (36.7 C) (11/16 0415) Pulse Rate:  [68-127] 68 (11/16 0502) Resp:  [16-37] 23 (11/16 0415) BP: (123-178)/(47-82) 123/47 mmHg (11/16 0415) SpO2:  [91 %-100 %] 100 % (11/16 0415) FiO2 (%):  [30 %-50 %] 30 % (11/16 0502) Weight:  [143 lb 8.3 oz (65.1 kg)] 143 lb 8.3 oz (65.1 kg) (11/16 0415)    Intake/Output from previous day: 11/15 0701 - 11/16 0700 In: 1350 [P.O.:360; I.V.:840; IV Piggyback:150] Out: 1050 [Urine:1050] Intake/Output this shift:    PE: Abd: soft, does not grimace or wake up with palpation to the abdomen. +BS, ND  Lab Results:   Recent Labs  03/07/13 0435 03/08/13 0325  WBC 8.8 9.9  HGB 8.5* 9.6*  HCT 26.0* 28.4*  PLT 153 159   BMET  Recent Labs  03/07/13 0435 03/08/13 0325  NA 145 141  K 4.4 4.0  CL 112 108  CO2 26 26  GLUCOSE 87 105*  BUN 40* 27*  CREATININE 1.42* 0.73  CALCIUM 7.3* 8.5   PT/INR No results found for this basename: LABPROT, INR,  in the last 72 hours CMP     Component Value Date/Time   NA 141 03/08/2013 0325   K 4.0 03/08/2013 0325   CL 108 03/08/2013 0325   CO2 26 03/08/2013 0325   GLUCOSE 105* 03/08/2013 0325   BUN 27* 03/08/2013 0325   CREATININE 0.73 03/08/2013 0325   CALCIUM 8.5 03/08/2013 0325   PROT 4.7* 03/07/2013 0435   ALBUMIN 2.4* 03/07/2013 0435   AST 47* 03/07/2013 0435   ALT 17 03/07/2013 0435   ALKPHOS 53 03/07/2013 0435   BILITOT 0.2* 03/07/2013 0435   GFRNONAA 80* 03/08/2013 0325   GFRAA >90 03/08/2013 0325   Lipase     Component Value Date/Time   LIPASE 26 03/06/2013 0245       Studies/Results: Dg Chest Port 1 View  03/08/2013   CLINICAL DATA:  Dyspnea.  EXAM: PORTABLE CHEST - 1 VIEW  COMPARISON:  Chest radiograph from 03/07/2013   FINDINGS: The lungs are mildly hypoexpanded. Vascular congestion is noted, with bibasilar airspace opacities. This may reflect mildly worsened pulmonary edema or possibly pneumonia. A small left pleural effusion is seen. No pneumothorax is identified.  The cardiomediastinal silhouette is mildly enlarged. No acute osseous abnormalities are seen.  IMPRESSION: Lungs mildly hypoexpanded. Vascular congestion and mild cardiomegaly, with bibasilar airspace opacities, may reflect mildly worsened pulmonary edema or possibly pneumonia. Small left pleural effusion seen.   Electronically Signed   By: Roanna Raider M.D.   On: 03/08/2013 23:05   Dg Abd Portable 1v  03/08/2013   CLINICAL DATA:  Abdominal pain  EXAM: PORTABLE ABDOMEN - 1 VIEW  COMPARISON:  None.  FINDINGS: Supine portable abdominal view obtained. Scattered air throughout the small and large bowel. Negative for obstruction or ileus. No abnormal calcifications. Degenerative changes of the spine and hips. Bones are osteopenic.  IMPRESSION: Negative for obstruction or ileus.   Electronically Signed   By: Ruel Favors M.D.   On: 03/08/2013 10:01    Anti-infectives: Anti-infectives   Start     Dose/Rate Route Frequency Ordered Stop   03/09/13 0200  vancomycin (VANCOCIN) 500 mg in sodium  chloride 0.9 % 100 mL IVPB     500 mg 100 mL/hr over 60 Minutes Intravenous Every 12 hours 03/09/13 0134     03/07/13 1600  piperacillin-tazobactam (ZOSYN) IVPB 3.375 g     3.375 g 12.5 mL/hr over 240 Minutes Intravenous Every 8 hours 03/07/13 1005     03/06/13 0800  piperacillin-tazobactam (ZOSYN) IVPB 2.25 g  Status:  Discontinued     2.25 g 100 mL/hr over 30 Minutes Intravenous Every 8 hours 03/06/13 0256 03/07/13 1004   03/06/13 0215  piperacillin-tazobactam (ZOSYN) IVPB 3.375 g     3.375 g 100 mL/hr over 30 Minutes Intravenous  Once 03/06/13 0204 03/06/13 0339       Assessment/Plan  1. Portal venous gas, unknown etiology  2. Dementia  Plan: 1. Will  hold off on repeat CT scan.  Her abdominal exam seems benign for now.  Her vitals are all stable with a normal WBC.  Will follow.  LOS: 3 days    Gae Bihl E 03/09/2013, 7:35 AM Pager: [phone]

## 2013-03-09 NOTE — Progress Notes (Addendum)
[facility] Progress Note [place] TEAM 1 - Stepdown/ICU TEAM   Yolanda Medina ZOX:[id number] DOB: 1934/02/21 DOA: 03/06/2013 PCP: Provider Not In System  Admit HPI / Brief Narrative: 79 y/o female with dementia who lives in a SNF was admitted on 11/13 in the early AM on transfer from the [place] ED for confusion and a possible GI bleed. She was confused at presentation and no family was available so history was obtained by chart review. It appeared she came in from her nursing home on 11/12 confused, hypoxemic and developed shock. In the ED she vomited "coffee grounds" which were heme positive. She was found to be in acute renal failure.  SIGNIFICANT EVENTS / STUDIES:  11/12 CT head [facility]): NAICP  11/12 CT abdomen: no free air, small amount of portal venous gas  11/13 Off vasopressors.  11/14 Poorly oriented. No distress. Transfer to SDU.  11/15 [facility] assumed care   Assessment/Plan:  Severe sepsis - abdominal source versus UTI BP stable - appears to be recovering nicely  Portal venous gas Etiology unclear - Gen Surgery following - no further evaluation planned at this time - exam benign  E coli UTI / pyelonephritis  Multi drug resistant - rocephin sensitive   HTN BP better controlled   Tachycardia HR better controlled   Possible aspiration with mild hypoxemia Wean O2 down as able - had brief episode of resp distress last night, but has recovered nicely    Acute kidney injury - resolved crt has normalized  Mild anemia without acute blood loss Hgb is improving - no evidence of acute blood loss  DM2 CBG well controlled  Acute encephalopathy / Agitation / Baseline Dementia / polypharmacy  Code Status: FULL Family Communication: no family present at time of exam - spoke w/ son via telephone Disposition Plan: SDU  Consultants: PCCM >> [facility] Gen Surgery  Procedures: none  Antibiotics: Zosyn 11/12 >>   DVT  prophylaxis: lovenox  HPI/Subjective: The patient is alert and conversant.  She appears comfortable.  She denies complaints, but is confused, though not agitated.  Objective: Blood pressure 168/53, pulse 101, temperature 98.3 F (36.8 C), temperature source Oral, resp. rate 31, height 5\' 3"  (1.6 m), weight 65.1 kg (143 lb 8.3 oz), SpO2 95.00%.  Intake/Output Summary (Last 24 hours) at 03/09/13 1330 Last data filed at 03/09/13 1228  Gross per 24 hour  Intake    415 ml  Output   1500 ml  Net  -1085 ml   Exam: General: No acute respiratory distress at rest  Lungs: Clear to auscultation bilaterally without wheezes or crackles Cardiovascular: Regular rate and rhythm without murmur gallop or rub normal S1 and S2 Abdomen: Nontender, nondistended, soft, bowel sounds positive, no rebound, no ascites, no appreciable mass Extremities: No significant cyanosis, clubbing, or edema bilateral lower extremities  Data Reviewed: Basic Metabolic Panel:  Recent Labs Lab 03/06/13 0245 03/07/13 0435 03/08/13 0325  NA 138 145 141  K 4.9 4.4 4.0  CL 102 112 108  CO2 24 26 26   GLUCOSE 122* 87 105*  BUN 47* 40* 27*  CREATININE 2.91* 1.42* 0.73  CALCIUM 6.8* 7.3* 8.5  MG 1.6  --   --   PHOS 6.5*  --   --    Liver Function Tests:  Recent Labs Lab 03/06/13 0245 03/07/13 0435  AST 56* 47*  ALT 17 17  ALKPHOS 51 53  BILITOT 0.2* 0.2*  PROT 5.1* 4.7*  ALBUMIN 2.8* 2.4*    Recent Labs  Lab 03/06/13 0245  LIPASE 26  AMYLASE 192*   CBC:  Recent Labs Lab 03/06/13 0245 03/06/13 1440 03/07/13 0435 03/08/13 0325  WBC 11.8* 7.3 8.8 9.9  NEUTROABS 10.0*  --   --   --   HGB 9.6* 9.4* 8.5* 9.6*  HCT 29.7* 27.7* 26.0* 28.4*  MCV 94.6 93.0 93.2 92.2  PLT 221 166 153 159   Cardiac Enzymes:  Recent Labs Lab 03/06/13 0201 03/06/13 0810 03/06/13 1440  TROPONINI <0.30 <0.30 <0.30   BNP (last 3 results)  Recent Labs  03/06/13 0201  PROBNP 788.5*   CBG:  Recent Labs Lab  03/08/13 1044 03/08/13 1744 03/08/13 2205 03/09/13 0741 03/09/13 1214  GLUCAP 128* 115* 123* 98 101*    Recent Results (from the past 240 hour(s))  MRSA PCR SCREENING     Status: None   Collection Time    03/06/13  1:44 AM      Result Value Range Status   MRSA by PCR NEGATIVE  NEGATIVE Final   Comment:            The GeneXpert MRSA Assay (FDA     approved for NASAL specimens     only), is one component of a     comprehensive MRSA colonization     surveillance program. It is not     intended to diagnose MRSA     infection nor to guide or     monitor treatment for     MRSA infections.  CULTURE, BLOOD (ROUTINE X 2)     Status: None   Collection Time    03/06/13  2:55 AM      Result Value Range Status   Specimen Description BLOOD LEFT ARM   Final   Special Requests BOTTLES DRAWN AEROBIC ONLY [facility]   Final   Culture  Setup Time     Final   Value: 03/06/2013 09:41     Performed at [facility]   Culture     Final   Value:        BLOOD CULTURE RECEIVED NO GROWTH TO DATE CULTURE WILL BE HELD FOR 5 DAYS BEFORE ISSUING A FINAL NEGATIVE REPORT     Performed at [facility]   Report Status PENDING   Incomplete  CULTURE, BLOOD (ROUTINE X 2)     Status: None   Collection Time    03/06/13  5:00 AM      Result Value Range Status   Specimen Description BLOOD RIGHT RADIAL A-LINE   Final   Special Requests BOTTLES DRAWN AEROBIC AND ANAEROBIC 10CC EACH   Final   Culture  Setup Time     Final   Value: 03/06/2013 11:25     Performed at [facility]   Culture     Final   Value:        BLOOD CULTURE RECEIVED NO GROWTH TO DATE CULTURE WILL BE HELD FOR 5 DAYS BEFORE ISSUING A FINAL NEGATIVE REPORT     Performed at [facility]   Report Status PENDING   Incomplete  URINE CULTURE     Status: None   Collection Time    03/06/13  5:41 AM      Result Value Range Status   Specimen Description URINE, CATHETERIZED   Final   Special Requests Normal   Final   Culture   Setup Time     Final   Value: 03/06/2013 18:21     Performed at [facility]  Culture     Final   Value: >=100,000 COLONIES/mL ESCHERICHIA COLI     Performed at [facility]   Report Status 03/08/2013 FINAL   Final   Organism ID, Bacteria ESCHERICHIA COLI   Final     Studies:  Recent x-ray studies have been reviewed in detail by the Attending Physician  Scheduled Meds:  Scheduled Meds: . enoxaparin (LOVENOX) injection  30 mg Subcutaneous Q24H  . insulin aspart  0-5 Units Subcutaneous QHS  . insulin aspart  0-9 Units Subcutaneous TID WC  . labetalol  100 mg Oral BID  . levalbuterol  0.63 mg Nebulization Q6H  . pantoprazole  40 mg Oral Q1200  . piperacillin-tazobactam (ZOSYN)  IV  3.375 g Intravenous Q8H  . traZODone  100 mg Oral QHS  . vancomycin  500 mg Intravenous Q12H    Time spent on care of this patient: 35 mins   [facility] T  [facility] Office  [phone] Pager - Text Page per Loretha Stapler as per below:  On-Call/Text Page:      [email or web address]      password [email or web address]  If 7PM-7AM, please contact night-coverage [email or web address] Password [email or web address] 03/09/2013, 1:30 PM   LOS: 3 days

## 2013-03-10 ENCOUNTER — Encounter (HOSPITAL_COMMUNITY): Payer: Self-pay | Admitting: *Deleted

## 2013-03-10 LAB — CBC
Hemoglobin: 9.6 g/dL — ABNORMAL LOW (ref 12.0–15.0)
MCH: 30.9 pg (ref 26.0–34.0)
MCHC: 34.9 g/dL (ref 30.0–36.0)
MCV: 88.4 fL (ref 78.0–100.0)
RBC: 3.11 MIL/uL — ABNORMAL LOW (ref 3.87–5.11)
WBC: 6.8 10*3/uL (ref 4.0–10.5)

## 2013-03-10 LAB — GLUCOSE, CAPILLARY
Glucose-Capillary: 96 mg/dL (ref 70–99)
Glucose-Capillary: 159 mg/dL — ABNORMAL HIGH (ref 70–99)
Glucose-Capillary: 87 mg/dL (ref 70–99)

## 2013-03-10 LAB — BASIC METABOLIC PANEL
BUN: 11 mg/dL (ref 6–23)
GFR calc non Af Amer: 85 mL/min — ABNORMAL LOW (ref >90)
Glucose, Bld: 88 mg/dL (ref 70–99)
Potassium: 2.7 mEq/L — CL (ref 3.5–5.1)

## 2013-03-10 MED ORDER — DEXTROSE-NACL 5-0.45 % IV SOLN
INTRAVENOUS | Status: DC
  Administered 2013-03-10: 50 mL/h via INTRAVENOUS

## 2013-03-10 MED ORDER — POTASSIUM CHLORIDE 10 MEQ/100ML IV SOLN: 10.0000 meq | INTRAVENOUS | Status: DC

## 2013-03-10 MED ORDER — POTASSIUM CHLORIDE 10 MEQ/100ML IV SOLN
10.0000 meq | INTRAVENOUS | Status: AC
  Administered 2013-03-10 (×4): 10 meq via INTRAVENOUS
  Filled 2013-03-10: qty 100

## 2013-03-10 MED ORDER — POTASSIUM CHLORIDE 10 MEQ/100ML IV SOLN
10.0000 meq | INTRAVENOUS | Status: AC
  Administered 2013-03-10 (×3): 10 meq via INTRAVENOUS
  Filled 2013-03-10: qty 100

## 2013-03-10 MED ORDER — IOHEXOL 300 MG/ML  SOLN
25.0000 mL | INTRAMUSCULAR | Status: AC
  Administered 2013-03-10: 25 mL via ORAL

## 2013-03-10 NOTE — Progress Notes (Signed)
  Subjective: She is really unable to communicate  Objective: Vital signs in last 24 hours: Temp:  [98.3 F (36.8 C)-100.5 F (38.1 C)] 98.9 F (37.2 C) (11/17 0751) Pulse Rate:  [85-128] 108 (11/17 0751) Resp:  [20-42] 32 (11/17 0751) BP: (142-192)/(53-87) 176/72 mmHg (11/17 0751) SpO2:  [94 %-99 %] 96 % (11/17 0751) FiO2 (%):  [30 %] 30 % (11/17 0409) Weight:  [144 lb 6.4 oz (65.5 kg)] 144 lb 6.4 oz (65.5 kg) (11/17 0408)    Intake/Output from previous day: 11/16 0701 - 11/17 0700 In: 620 [I.V.:420; IV Piggyback:200] Out: 1950 [Urine:1950] Intake/Output this shift:    GI: soft, unable to assess any tenderness due to mental status  Lab Results:   Recent Labs  03/08/13 0325 03/10/13 0513  WBC 9.9 6.8  HGB 9.6* 9.6*  HCT 28.4* 27.5*  PLT 159 185   BMET  Recent Labs  03/08/13 0325 03/10/13 0513  NA 141 138  K 4.0 2.7*  CL 108 101  CO2 26 26  GLUCOSE 105* 88  BUN 27* 11  CREATININE 0.73 0.61  CALCIUM 8.5 8.3*   PT/INR No results found for this basename: LABPROT, INR,  in the last 72 hours ABG  Recent Labs  03/08/13 2230 03/09/13 0426  PHART 7.520* 7.466*  HCO3 25.4* 27.6*    Studies/Results: Dg Chest Port 1 View  03/08/2013   CLINICAL DATA:  Dyspnea.  EXAM: PORTABLE CHEST - 1 VIEW  COMPARISON:  Chest radiograph from 03/07/2013  FINDINGS: The lungs are mildly hypoexpanded. Vascular congestion is noted, with bibasilar airspace opacities. This may reflect mildly worsened pulmonary edema or possibly pneumonia. A small left pleural effusion is seen. No pneumothorax is identified.  The cardiomediastinal silhouette is mildly enlarged. No acute osseous abnormalities are seen.  IMPRESSION: Lungs mildly hypoexpanded. Vascular congestion and mild cardiomegaly, with bibasilar airspace opacities, may reflect mildly worsened pulmonary edema or possibly pneumonia. Small left pleural effusion seen.   Electronically Signed   By: Roanna Raider M.D.   On: 03/08/2013  23:05    Anti-infectives: Anti-infectives   Start     Dose/Rate Route Frequency Ordered Stop   03/09/13 0200  vancomycin (VANCOCIN) 500 mg in sodium chloride 0.9 % 100 mL IVPB     500 mg 100 mL/hr over 60 Minutes Intravenous Every 12 hours 03/09/13 0134     03/07/13 1600  piperacillin-tazobactam (ZOSYN) IVPB 3.375 g     3.375 g 12.5 mL/hr over 240 Minutes Intravenous Every 8 hours 03/07/13 1005     03/06/13 0800  piperacillin-tazobactam (ZOSYN) IVPB 2.25 g  Status:  Discontinued     2.25 g 100 mL/hr over 30 Minutes Intravenous Every 8 hours 03/06/13 0256 03/07/13 1004   03/06/13 0215  piperacillin-tazobactam (ZOSYN) IVPB 3.375 g     3.375 g 100 mL/hr over 30 Minutes Intravenous  Once 03/06/13 0204 03/06/13 0981      Assessment/Plan: Portal venous gas, ?etiology  I cannot really tell anything by her exam today due to mental status.  She does have some tachycardia and low grade fever.  I think a repeat ct scan as we didn't really identify any source would be appropriate if medical team thinks ok.  [facility] 03/10/2013

## 2013-03-10 NOTE — Progress Notes (Signed)
[facility] Progress Note [place] TEAM 1 - Stepdown/ICU TEAM   Yolanda Medina RUE:[id number] DOB: 1933-06-09 DOA: 03/06/2013 PCP: Provider Not In System  Admit HPI / Brief Narrative: 79 y/o female with dementia who lives in a SNF was admitted on 11/13 in the early AM on transfer from the [place] ED for confusion and a possible GI bleed. She was confused at presentation and no family was available so history was obtained by chart review. It appeared she came in from her nursing home on 11/12 confused, hypoxemic and developed shock. In the ED she vomited "coffee grounds" which were heme positive. She was found to be in acute renal failure.  SIGNIFICANT EVENTS / STUDIES:  11/12 CT head [facility]): NAICP  11/12 CT abdomen: no free air, small amount of portal venous gas  11/13 Off vasopressors.  11/14 Poorly oriented. No distress. Transfer to SDU.  11/15 [facility] assumed care   Assessment/Plan:  Severe sepsis - abdominal source versus UTI BP stable - appears to be recovering nicely  Portal venous gas Etiology unclear - Gen Surgery following - exam benign - to have a followup CT scan today (I do agree this is reasonable and there is no present contraindication)  E coli UTI / pyelonephritis  Multi drug resistant - rocephin sensitive - if CT scan of abdomen is unrevealing we'll plan to narrow antibiotics to Rocephin to complete 10 full days of treatment  HTN BP remains elevated - adjust treatment plan and follow  Tachycardia HR better controlled   Possible aspiration with mild hypoxemia Continue to wean option support as able - no respiratory distress at time of exam today  Acute kidney injury - resolved crt has normalized  Mild anemia without acute blood loss Hgb is improving - no evidence of acute blood loss  DM2 CBG well controlled  Hypokalemia Replace and follow trend - check magnesium level  Acute encephalopathy / Agitation / Baseline Dementia /  polypharmacy The patient appears to be much more stable from a mental status standpoint today  Code Status: FULL Family Communication: no family present at time of exam - spoke w/ son via telephone 11/16 Disposition Plan: SDU  Consultants: PCCM >> [facility] Gen Surgery  Procedures: none  Antibiotics: Zosyn 11/12 >>   DVT prophylaxis: lovenox  HPI/Subjective: The patient is more alert today than I have seen her.  She answers some simple questions but I suspect her history is not reliable due to confusion/dementia.  She appears comfortable.  She denies complaints, but is confused, though not agitated.  Objective: Blood pressure 162/93, pulse 108, temperature 98.9 F (37.2 C), temperature source Axillary, resp. rate 32, height 5\' 3"  (1.6 m), weight 65.5 kg (144 lb 6.4 oz), SpO2 99.00%.  Intake/Output Summary (Last 24 hours) at 03/10/13 1250 Last data filed at 03/10/13 0600  Gross per 24 hour  Intake    570 ml  Output   1500 ml  Net   -930 ml   Exam: General: No acute respiratory distress at rest  Lungs: Clear to auscultation bilaterally without wheezes or crackles Cardiovascular: Regular rate and rhythm without murmur gallop or rub normal S1 and S2 Abdomen: Nontender, nondistended, soft, bowel sounds positive, no rebound, no ascites, no appreciable mass Extremities: No significant cyanosis, clubbing, edema bilateral lower extremities  Data Reviewed: Basic Metabolic Panel:  Recent Labs Lab 03/06/13 0245 03/07/13 0435 03/08/13 0325 03/10/13 0513  NA 138 145 141 138  K 4.9 4.4 4.0 2.7*  CL 102 112 108  101  CO2 24 26 26 26   GLUCOSE 122* 87 105* 88  BUN 47* 40* 27* 11  CREATININE 2.91* 1.42* 0.73 0.61  CALCIUM 6.8* 7.3* 8.5 8.3*  MG 1.6  --   --  1.5  PHOS 6.5*  --   --   --    Liver Function Tests:  Recent Labs Lab 03/06/13 0245 03/07/13 0435  AST 56* 47*  ALT 17 17  ALKPHOS 51 53  BILITOT 0.2* 0.2*  PROT 5.1* 4.7*  ALBUMIN 2.8* 2.4*    Recent Labs Lab  03/06/13 0245  LIPASE 26  AMYLASE 192*   CBC:  Recent Labs Lab 03/06/13 0245 03/06/13 1440 03/07/13 0435 03/08/13 0325 03/10/13 0513  WBC 11.8* 7.3 8.8 9.9 6.8  NEUTROABS 10.0*  --   --   --   --   HGB 9.6* 9.4* 8.5* 9.6* 9.6*  HCT 29.7* 27.7* 26.0* 28.4* 27.5*  MCV 94.6 93.0 93.2 92.2 88.4  PLT 221 166 153 159 185   Cardiac Enzymes:  Recent Labs Lab 03/06/13 0201 03/06/13 0810 03/06/13 1440  TROPONINI <0.30 <0.30 <0.30   BNP (last 3 results)  Recent Labs  03/06/13 0201  PROBNP 788.5*   CBG:  Recent Labs Lab 03/08/13 2205 03/09/13 0741 03/09/13 1214 03/09/13 1605 03/09/13 2144  GLUCAP 123* 98 101* 90 93    Recent Results (from the past 240 hour(s))  MRSA PCR SCREENING     Status: None   Collection Time    03/06/13  1:44 AM      Result Value Range Status   MRSA by PCR NEGATIVE  NEGATIVE Final   Comment:            The GeneXpert MRSA Assay (FDA     approved for NASAL specimens     only), is one component of a     comprehensive MRSA colonization     surveillance program. It is not     intended to diagnose MRSA     infection nor to guide or     monitor treatment for     MRSA infections.  CULTURE, BLOOD (ROUTINE X 2)     Status: None   Collection Time    03/06/13  2:55 AM      Result Value Range Status   Specimen Description BLOOD LEFT ARM   Final   Special Requests BOTTLES DRAWN AEROBIC ONLY [facility]   Final   Culture  Setup Time     Final   Value: 03/06/2013 09:41     Performed at [facility]   Culture     Final   Value:        BLOOD CULTURE RECEIVED NO GROWTH TO DATE CULTURE WILL BE HELD FOR 5 DAYS BEFORE ISSUING A FINAL NEGATIVE REPORT     Performed at [facility]   Report Status PENDING   Incomplete  CULTURE, BLOOD (ROUTINE X 2)     Status: None   Collection Time    03/06/13  5:00 AM      Result Value Range Status   Specimen Description BLOOD RIGHT RADIAL A-LINE   Final   Special Requests BOTTLES DRAWN AEROBIC AND  ANAEROBIC 10CC EACH   Final   Culture  Setup Time     Final   Value: 03/06/2013 11:25     Performed at [facility]   Culture     Final   Value:        BLOOD CULTURE RECEIVED  NO GROWTH TO DATE CULTURE WILL BE HELD FOR 5 DAYS BEFORE ISSUING A FINAL NEGATIVE REPORT     Performed at [facility]   Report Status PENDING   Incomplete  URINE CULTURE     Status: None   Collection Time    03/06/13  5:41 AM      Result Value Range Status   Specimen Description URINE, CATHETERIZED   Final   Special Requests Normal   Final   Culture  Setup Time     Final   Value: 03/06/2013 18:21     Performed at [facility]   Culture     Final   Value: >=100,000 COLONIES/mL ESCHERICHIA COLI     Performed at [facility]   Report Status 03/08/2013 FINAL   Final   Organism ID, Bacteria ESCHERICHIA COLI   Final     Studies:  Recent x-ray studies have been reviewed in detail by the Attending Physician  Scheduled Meds:  Scheduled Meds: . enoxaparin (LOVENOX) injection  30 mg Subcutaneous Q24H  . insulin aspart  0-5 Units Subcutaneous QHS  . insulin aspart  0-9 Units Subcutaneous TID WC  . labetalol  100 mg Oral BID  . levalbuterol  0.63 mg Nebulization Q6H  . pantoprazole  40 mg Oral Q1200  . piperacillin-tazobactam (ZOSYN)  IV  3.375 g Intravenous Q8H  . traZODone  100 mg Oral QHS  . vancomycin  500 mg Intravenous Q12H    Time spent on care of this patient: 35 mins   [facility] T  [facility] Office  [phone] Pager - Text Page per Loretha Stapler as per below:  On-Call/Text Page:      [email or web address]      password [email or web address]  If 7PM-7AM, please contact night-coverage [email or web address] Password [email or web address] 03/10/2013, 12:50 PM   LOS: 4 days

## 2013-03-11 ENCOUNTER — Inpatient Hospital Stay (HOSPITAL_COMMUNITY): Payer: Medicare Other

## 2013-03-11 ENCOUNTER — Encounter (HOSPITAL_COMMUNITY): Payer: Self-pay | Admitting: Radiology

## 2013-03-11 DIAGNOSIS — J189 Pneumonia, unspecified organism: Secondary | ICD-10-CM | POA: Clinically undetermined

## 2013-03-11 LAB — BASIC METABOLIC PANEL
BUN: 8 mg/dL (ref 6–23)
CO2: 25 mEq/L (ref 19–32)
Chloride: 104 mEq/L (ref 96–112)
Creatinine, Ser: 0.64 mg/dL (ref 0.50–1.10)
GFR calc Af Amer: >90 mL/min (ref >90)
Glucose, Bld: 87 mg/dL (ref 70–99)
Sodium: 138 mEq/L (ref 135–145)

## 2013-03-11 LAB — PROCALCITONIN: Procalcitonin: 0.52 ng/mL

## 2013-03-11 LAB — GLUCOSE, CAPILLARY
Glucose-Capillary: 105 mg/dL — ABNORMAL HIGH (ref 70–99)
Glucose-Capillary: 97 mg/dL (ref 70–99)

## 2013-03-11 MED ORDER — IOHEXOL 300 MG/ML  SOLN
100.0000 mL | Freq: Once | INTRAMUSCULAR | Status: AC | PRN
  Administered 2013-03-11: 100 mL via INTRAVENOUS

## 2013-03-11 MED ORDER — ENOXAPARIN SODIUM 40 MG/0.4ML ~~LOC~~ SOLN
40.0000 mg | SUBCUTANEOUS | Status: DC
  Administered 2013-03-11 – 2013-03-12 (×2): 40 mg via SUBCUTANEOUS
  Filled 2013-03-11 (×4): qty 0.4

## 2013-03-11 MED ORDER — TRAMADOL HCL 50 MG PO TABS
50.0000 mg | ORAL_TABLET | Freq: Four times a day (QID) | ORAL | Status: DC | PRN
  Administered 2013-03-11 – 2013-03-13 (×6): 50 mg via ORAL
  Filled 2013-03-11 (×6): qty 1

## 2013-03-11 MED ORDER — HYDRALAZINE HCL 20 MG/ML IJ SOLN
10.0000 mg | INTRAMUSCULAR | Status: DC | PRN
  Administered 2013-03-11: 19:00:00 10 mg via INTRAVENOUS
  Filled 2013-03-11: qty 1

## 2013-03-11 NOTE — Progress Notes (Signed)
Patient ID: Yolanda Medina, female   DOB: May 10, 1933, 79 y.o.   MRN: [id number] earache   Subjective: Pt lying in bed.  NAD.  Denies abdominal pain, nausea or vomiting.    Objective:  Vital signs:  Filed Vitals:   03/10/13 2039 03/10/13 2156 03/10/13 2337 03/11/13 0436  BP: 184/70  132/67 169/73  Pulse: 97     Temp: 98.4 F (36.9 C)  98.5 F (36.9 C) 98.5 F (36.9 C)  TempSrc: Oral  Oral Oral  Resp: 35  24 27  Height:      Weight:      SpO2: 100% 98%  98%       Intake/Output   Yesterday:  11/17 0701 - 11/18 0700 In: 2232.5 [P.O.:720; I.V.:877.5; IV Piggyback:635] Out: 2801 [Urine:2800; Stool:1] This shift:     Physical Exam:  General: Pt awake/alert/oriented and in no acute distress Abdomen: Soft.  Nondistended. Non tender.  Bowel sounds are present.  No evidence of peritonitis.  No incarcerated hernias.   Problem List:   Principal Problem:   Septic shock(785.52) Active Problems:   AKI (acute kidney injury)   Encephalopathy acute   Pneumobilia   UTI (lower urinary tract infection)    Results:   Labs: Results for orders placed during the hospital encounter of 03/06/13 (from the past 48 hour(s))  GLUCOSE, CAPILLARY     Status: None   Collection Time    03/09/13  7:41 AM      Result Value Range   Glucose-Capillary 98  70 - 99 mg/dL  GLUCOSE, CAPILLARY     Status: Abnormal   Collection Time    03/09/13 12:14 PM      Result Value Range   Glucose-Capillary 101 (*) 70 - 99 mg/dL   Comment 1 Notify RN    GLUCOSE, CAPILLARY     Status: None   Collection Time    03/09/13  4:05 PM      Result Value Range   Glucose-Capillary 90  70 - 99 mg/dL  GLUCOSE, CAPILLARY     Status: None   Collection Time    03/09/13  9:44 PM      Result Value Range   Glucose-Capillary 93  70 - 99 mg/dL  BASIC METABOLIC PANEL     Status: Abnormal   Collection Time    03/10/13  5:13 AM      Result Value Range   Sodium 138  135 - 145 mEq/L   Potassium 2.7 (*) 3.5 - 5.1  mEq/L   Comment: CRITICAL RESULT CALLED TO, READ BACK BY AND VERIFIED WITH:     T.IRBY,RN 03/10/13 0656 BY BSLADE   Chloride 101  96 - 112 mEq/L   CO2 26  19 - 32 mEq/L   Glucose, Bld 88  70 - 99 mg/dL   BUN 11  6 - 23 mg/dL   Creatinine, Ser 4.09  0.50 - 1.10 mg/dL   Calcium 8.3 (*) 8.4 - 10.5 mg/dL   GFR calc non Af Amer 85 (*) >90 mL/min   GFR calc Af Amer >90  >90 mL/min   Comment: (NOTE)     The eGFR has been calculated using the CKD EPI equation.     This calculation has not been validated in all clinical situations.     eGFR's persistently <90 mL/min signify possible Chronic Kidney     Disease.  CBC     Status: Abnormal   Collection Time    03/10/13  5:13 AM  Result Value Range   WBC 6.8  4.0 - 10.5 K/uL   RBC 3.11 (*) 3.87 - 5.11 MIL/uL   Hemoglobin 9.6 (*) 12.0 - 15.0 g/dL   HCT 45.4 (*) 09.8 - 11.9 %   MCV 88.4  78.0 - 100.0 fL   MCH 30.9  26.0 - 34.0 pg   MCHC 34.9  30.0 - 36.0 g/dL   RDW 14.7  82.9 - 56.2 %   Platelets 185  150 - 400 K/uL  MAGNESIUM     Status: None   Collection Time    03/10/13  5:13 AM      Result Value Range   Magnesium 1.5  1.5 - 2.5 mg/dL  GLUCOSE, CAPILLARY     Status: None   Collection Time    03/10/13  7:40 AM      Result Value Range   Glucose-Capillary 96  70 - 99 mg/dL  GLUCOSE, CAPILLARY     Status: Abnormal   Collection Time    03/10/13 11:59 AM      Result Value Range   Glucose-Capillary 159 (*) 70 - 99 mg/dL  GLUCOSE, CAPILLARY     Status: Abnormal   Collection Time    03/10/13  5:00 PM      Result Value Range   Glucose-Capillary 104 (*) 70 - 99 mg/dL   Comment 1 Notify RN    GLUCOSE, CAPILLARY     Status: None   Collection Time    03/10/13  8:42 PM      Result Value Range   Glucose-Capillary 87  70 - 99 mg/dL   Comment 1 Documented in Chart     Comment 2 Notify RN      Imaging / Studies: Ct Abdomen Pelvis W Contrast  03/11/2013   CLINICAL DATA:  Evaluate for portal venous gas, which was reportedly seen on  outside CT imaging  EXAM: CT ABDOMEN AND PELVIS WITH CONTRAST  TECHNIQUE: Multidetector CT imaging of the abdomen and pelvis was performed using the standard protocol following bolus administration of intravenous contrast.  CONTRAST:  OMNIPAQUE IOHEXOL 300 MG/ML  SOLN  COMPARISON:  None.  FINDINGS: BODY WALL: Medication injection side in the lower right abdomen. Mild body wall edema.  LOWER CHEST: Small to moderate bilateral pleural effusions which are layering. There is patchy airspace disease in the lower lungs.  ABDOMEN/PELVIS:  Liver: No focal abnormality.  Biliary: No evidence of biliary obstruction or stone.  Pancreas: Unremarkable.  Spleen: Unremarkable.  Adrenals: Unremarkable.  Kidneys and ureters: Symmetric mid fullness of the urinary collecting systems. No stone.  Bladder: Drained by Foley catheter.  Reproductive: Hysterectomy.  Bowel: Circumferential thickening of the rectal wall, with fairly extensive mesorectal edema. There is moderate distention of the rectum by stool, measuring 6 cm in diameter. No bowel obstruction. Appendectomy.  Retroperitoneum: No mass or adenopathy.  Peritoneum: No free fluid or gas.  Vascular: No portal venous gas seen currently.  OSSEOUS: Left-sided laminotomy at L5-S1.  No acute osseous findings.  IMPRESSION: 1. No portal venous gas seen currently. 2. Proctitis and mesorectal edema. This could be infectious, inflammatory, or pressure related (stercoral colitis). 3. Bilateral pneumonia with small to moderate pleural effusions.   Electronically Signed   By: Tiburcio Pea M.D.   On: 03/11/2013 01:27    Medications / Allergies: per chart  Antibiotics: Anti-infectives   Start     Dose/Rate Route Frequency Ordered Stop   03/09/13 0200  vancomycin (VANCOCIN) 500 mg in sodium chloride  0.9 % 100 mL IVPB  Status:  Discontinued     500 mg 100 mL/hr over 60 Minutes Intravenous Every 12 hours 03/09/13 0134 03/10/13 1627   03/07/13 1600  piperacillin-tazobactam (ZOSYN)  IVPB 3.375 g     3.375 g 12.5 mL/hr over 240 Minutes Intravenous Every 8 hours 03/07/13 1005     03/06/13 0800  piperacillin-tazobactam (ZOSYN) IVPB 2.25 g  Status:  Discontinued     2.25 g 100 mL/hr over 30 Minutes Intravenous Every 8 hours 03/06/13 0256 03/07/13 1004   03/06/13 0215  piperacillin-tazobactam (ZOSYN) IVPB 3.375 g     3.375 g 100 mL/hr over 30 Minutes Intravenous  Once 03/06/13 0204 03/06/13 1610      Assessment/Plan Questionable portal venous gas on CT -a repeat CT did not show evidence of portal venous gas, showed proctitis, bilateral pneumonias.  She has a benign abdominal exam.  No surgical intervention warranted.  Surgery signing off.  Please do not hesitate to contact [facility] with questions or concerns.  Ashok Norris, [facility] Surgery Pager [phone] Office [phone]  03/11/2013  7:40 AM

## 2013-03-11 NOTE — Progress Notes (Signed)
Pt complaining of burning with urination. Pt has foley inserted. RN went to assess pt's foley and it was hanging out. RN to check pt's foley bubble and couldn't get any fluid back from the bubble. RN reinserted pt's foley and inflated balloon with 10 cc of fluid. RN will page MD for further orders and continue to montior pt. Pt was given 50 of ultram po for pain. Pt with good urine output.

## 2013-03-11 NOTE — Progress Notes (Signed)
NURSING PROGRESS NOTE  MYKEL MOHL [id number] Transfer Data: 03/11/2013 6:49 PM Attending Provider: Calvert Cantor, MD WUJ:[id number] Not In System Code Status: full   Yolanda Medina is a 79 y.o. female patient transferred from stepdown  -No acute distress noted.  -No complaints of shortness of breath.  -No complaints of chest pain.   Cardiac Monitoring:N/A  Blood pressure 193/72, pulse 96, temperature 99.2 F (37.3 C), temperature source Oral, resp. rate 20, height 5\' 3"  (1.6 m), weight 65.5 kg (144 lb 6.4 oz), SpO2 97.00%.   IV Fluids:  IV in place, occlusive dsg intact without redness, IV cath L forearm DS half norm saline at 31ml/hour.  Allergies:  Acetaminophen; Ciprofloxacin; and Codeine  Past Medical History:   has a past medical history of Dementia; Anxiety; Paranoia; Hypertension; Hypercholesterolemia; and Diabetes mellitus.  Past Surgical History:   has no past surgical history on file.  Social History:   reports that she has never smoked. She does not have any smokeless tobacco history on file. She reports that she does not drink alcohol or use illicit drugs.  Skin: R hip, band-aid noted over tiny circular spot, dry skin noted, scant amt. Of blood noted with band-aid over it. Generalized bruising noted to skin, especially L hip area. Otherwise, skin intact.   Patient/Family orientated to room. Admission inpatient armband information verified with patient/family to include name and date of birth and placed on patient arm. Side rails up x 2, fall assessment and education completed with patient/family. Call light within reach.  Will continue to evaluate and treat per MD orders.

## 2013-03-11 NOTE — Progress Notes (Signed)
Pt refuses to wear CPAP.  °

## 2013-03-11 NOTE — Progress Notes (Addendum)
[facility] Progress Note [place] TEAM 1 - Stepdown/ICU TEAM   Yolanda Medina ZOX:[id number] DOB: 1934-03-09 DOA: 03/06/2013 PCP: Provider Not In System  Admit HPI / Brief Narrative: 79 y/o female with dementia who lives in a SNF was admitted on 11/13 in [facility] early AM on transfer from [facility] [place] ED for confusion and a possible GI bleed. She was confused at presentation and no family was available so history was obtained by chart review. It appeared she came in from her nursing home on 11/12 confused, hypoxemic and developed shock. In [facility] ED she vomited "coffee grounds" which were heme positive. She was found to be in acute renal failure.  SIGNIFICANT EVENTS / STUDIES:  11/12 CT head [facility]): NAICP  11/12 CT abdomen: no free air, small amount of portal venous gas  11/13 Off vasopressors.  11/14 Poorly oriented. No distress. Transfer to SDU.  11/15 [facility] assumed care   Assessment/Plan:  Severe sepsis - UTI and b/l PNA on CT chest - Question Proctatits on Ct? Pt having loose stools- check stool studies  Portal venous gas - exam benign - repeat CT negative for this  E coli UTI / pyelonephritis   - cont current antibiotics due to above mentioned CT findings of PNA and possible Proctitis  PNA- b/l bases - ronchi today may be significant for underlying Pneumonia- follow for now- question of aspiration- will obtain swallow eval  HTN BP remains elevated - adjust treatment plan and follow  Tachycardia HR better controlled today  Acute kidney injury - resolved crt has normalized  Mild anemia without acute blood loss Hgb is improving - no evidence of acute blood loss  DM2 CBG well controlled  Hypokalemia Replace and follow trend - check magnesium level  Acute encephalopathy / Agitation / Baseline Dementia / polypharmacy [facility] Medina appears to be much more stable from a mental status standpoint today  Code Status: FULL Family Communication:- spoke w/ son,  Yolanda Medina, today over phone Disposition Plan: transfer to med/surg  Consultants: PCCM >> [facility] Gen Surgery  Procedures: none  Antibiotics: Zosyn 11/12 >>   DVT prophylaxis: lovenox  HPI/Subjective: [facility] is more alert - per RN has been pocketing pills- tolerating liquids but significant ronchi  Objective: Blood pressure 180/74, pulse 78, temperature 98.4 F (36.9 C), temperature source Axillary, resp. rate 24, height 5\' 3"  (1.6 m), weight 65.5 kg (144 lb 6.4 oz), SpO2 97.00%.  Intake/Output Summary (Last 24 hours) at 03/11/13 1641 Last data filed at 03/11/13 1300  Gross per 24 hour  Intake 2047.5 ml  Output   2700 ml  Net -652.5 ml   Exam: General: No acute respiratory distress at rest - currently sitting in large amount of stool ans is unaware of it Lungs: mild b/l ronchi Cardiovascular: Regular rate and rhythm without murmur gallop or rub normal S1 and S2 Abdomen: Nontender, nondistended, soft, bowel sounds positive, no rebound, no ascites, no appreciable mass Extremities: No significant cyanosis, clubbing, edema bilateral lower extremities  Data Reviewed: Basic Metabolic Panel:  Recent Labs Lab 03/06/13 0245 03/07/13 0435 03/08/13 0325 03/10/13 0513 03/11/13 0725  NA 138 145 141 138 138  K 4.9 4.4 4.0 2.7* 3.6  CL 102 112 108 101 104  CO2 24 26 26 26 25   GLUCOSE 122* 87 105* 88 87  BUN 47* 40* 27* 11 8  CREATININE 2.91* 1.42* 0.73 0.61 0.64  CALCIUM 6.8* 7.3* 8.5 8.3* 8.1*  MG 1.6  --   --  1.5 1.5  PHOS 6.5*  --   --   --   --    Liver Function Tests:  Recent Labs Lab 03/06/13 0245 03/07/13 0435  AST 56* 47*  ALT 17 17  ALKPHOS 51 53  BILITOT 0.2* 0.2*  PROT 5.1* 4.7*  ALBUMIN 2.8* 2.4*    Recent Labs Lab 03/06/13 0245  LIPASE 26  AMYLASE 192*   CBC:  Recent Labs Lab 03/06/13 0245 03/06/13 1440 03/07/13 0435 03/08/13 0325 03/10/13 0513  WBC 11.8* 7.3 8.8 9.9 6.8  NEUTROABS 10.0*  --   --   --   --   HGB 9.6* 9.4* 8.5* 9.6*  9.6*  HCT 29.7* 27.7* 26.0* 28.4* 27.5*  MCV 94.6 93.0 93.2 92.2 88.4  PLT 221 166 153 159 185   Cardiac Enzymes:  Recent Labs Lab 03/06/13 0201 03/06/13 0810 03/06/13 1440  TROPONINI <0.30 <0.30 <0.30   BNP (last 3 results)  Recent Labs  03/06/13 0201  PROBNP 788.5*   CBG:  Recent Labs Lab 03/10/13 1159 03/10/13 1700 03/10/13 2042 03/11/13 0850 03/11/13 1243  GLUCAP 159* 104* 87 82 109*    Recent Results (from [facility] past 240 hour(s))  MRSA PCR SCREENING     Status: None   Collection Time    03/06/13  1:44 AM      Result Value Range Status   MRSA by PCR NEGATIVE  NEGATIVE Final   Comment:            [facility] GeneXpert MRSA Assay (FDA     approved for NASAL specimens     only), is one component of a     comprehensive MRSA colonization     surveillance program. It is not     intended to diagnose MRSA     infection nor to guide or     monitor treatment for     MRSA infections.  CULTURE, BLOOD (ROUTINE X 2)     Status: None   Collection Time    03/06/13  2:55 AM      Result Value Range Status   Specimen Description BLOOD LEFT ARM   Final   Special Requests BOTTLES DRAWN AEROBIC ONLY [facility]   Final   Culture  Setup Time     Final   Value: 03/06/2013 09:41     Performed at [facility]   Culture     Final   Value:        BLOOD CULTURE RECEIVED NO GROWTH TO DATE CULTURE WILL BE HELD FOR 5 DAYS BEFORE ISSUING A FINAL NEGATIVE REPORT     Performed at [facility]   Report Status PENDING   Incomplete  CULTURE, BLOOD (ROUTINE X 2)     Status: None   Collection Time    03/06/13  5:00 AM      Result Value Range Status   Specimen Description BLOOD RIGHT RADIAL A-LINE   Final   Special Requests BOTTLES DRAWN AEROBIC AND ANAEROBIC 10CC EACH   Final   Culture  Setup Time     Final   Value: 03/06/2013 11:25     Performed at [facility]   Culture     Final   Value:        BLOOD CULTURE RECEIVED NO GROWTH TO DATE CULTURE WILL BE HELD FOR 5 DAYS  BEFORE ISSUING A FINAL NEGATIVE REPORT     Performed at [facility]   Report Status PENDING   Incomplete  URINE CULTURE  Status: None   Collection Time    03/06/13  5:41 AM      Result Value Range Status   Specimen Description URINE, CATHETERIZED   Final   Special Requests Normal   Final   Culture  Setup Time     Final   Value: 03/06/2013 18:21     Performed at [facility]   Culture     Final   Value: >=100,000 COLONIES/mL ESCHERICHIA COLI     Performed at [facility]   Report Status 03/08/2013 FINAL   Final   Organism ID, Bacteria ESCHERICHIA COLI   Final  CLOSTRIDIUM DIFFICILE BY PCR     Status: None   Collection Time    03/11/13  2:57 PM      Result Value Range Status   C difficile by pcr NEGATIVE  NEGATIVE Final     Studies:  Recent x-ray studies have been reviewed in detail by [facility] Attending Physician  Scheduled Meds:  Scheduled Meds: . enoxaparin (LOVENOX) injection  40 mg Subcutaneous Q24H  . insulin aspart  0-5 Units Subcutaneous QHS  . insulin aspart  0-9 Units Subcutaneous TID WC  . labetalol  100 mg Oral BID  . levalbuterol  0.63 mg Nebulization Q6H  . pantoprazole  40 mg Oral Q1200  . piperacillin-tazobactam (ZOSYN)  IV  3.375 g Intravenous Q8H  . traZODone  100 mg Oral QHS    Time spent on care of this Medina: 35 mins   Calvert Cantor, MD  [facility] Office  [phone] Pager - Text Page per Loretha Stapler as per below:  On-Call/Text Page:      [email or web address]      password [email or web address]  If 7PM-7AM, please contact night-coverage [email or web address] Password [email or web address] 03/11/2013, 4:41 PM   LOS: 5 days

## 2013-03-11 NOTE — Progress Notes (Signed)
I don't think she has any issues which need to be addressed surgically, she has some edema around rectum but this is nonspecific and cant really correlate this with anything else right now.  Portal venous gas resolved, nl wbc, please call back if any questions

## 2013-03-12 ENCOUNTER — Inpatient Hospital Stay (HOSPITAL_COMMUNITY): Payer: Medicare Other

## 2013-03-12 LAB — URINALYSIS, ROUTINE W REFLEX MICROSCOPIC
Glucose, UA: NEGATIVE mg/dL
Ketones, ur: NEGATIVE mg/dL
Nitrite: NEGATIVE
Protein, ur: NEGATIVE mg/dL
Specific Gravity, Urine: 1.011 (ref 1.005–1.030)
Urobilinogen, UA: 0.2 mg/dL (ref 0.0–1.0)
pH: 8 (ref 5.0–8.0)

## 2013-03-12 LAB — CULTURE, BLOOD (ROUTINE X 2)
Culture: NO GROWTH
Culture: NO GROWTH

## 2013-03-12 LAB — BASIC METABOLIC PANEL
BUN: 7 mg/dL (ref 6–23)
Chloride: 102 mEq/L (ref 96–112)
GFR calc non Af Amer: 82 mL/min — ABNORMAL LOW (ref >90)
Glucose, Bld: 95 mg/dL (ref 70–99)
Potassium: 3.4 mEq/L — ABNORMAL LOW (ref 3.5–5.1)
Sodium: 136 mEq/L (ref 135–145)

## 2013-03-12 LAB — URINE MICROSCOPIC-ADD ON

## 2013-03-12 LAB — FECAL LACTOFERRIN, QUANT

## 2013-03-12 LAB — GLUCOSE, CAPILLARY
Glucose-Capillary: 97 mg/dL (ref 70–99)
Glucose-Capillary: 104 mg/dL — ABNORMAL HIGH (ref 70–99)
Glucose-Capillary: 89 mg/dL (ref 70–99)

## 2013-03-12 LAB — CBC
Hemoglobin: 8.9 g/dL — ABNORMAL LOW (ref 12.0–15.0)
MCHC: 34.5 g/dL (ref 30.0–36.0)
Platelets: 198 10*3/uL (ref 150–400)
RBC: 2.88 MIL/uL — ABNORMAL LOW (ref 3.87–5.11)

## 2013-03-12 MED ORDER — CEFUROXIME AXETIL 500 MG PO TABS
500.0000 mg | ORAL_TABLET | Freq: Two times a day (BID) | ORAL | Status: DC
  Administered 2013-03-12 – 2013-03-13 (×2): 500 mg via ORAL
  Filled 2013-03-12 (×4): qty 1

## 2013-03-12 MED ORDER — POTASSIUM CHLORIDE 20 MEQ/15ML (10%) PO LIQD
40.0000 meq | Freq: Once | ORAL | Status: AC
  Administered 2013-03-12: 40 meq via ORAL
  Filled 2013-03-12: qty 30

## 2013-03-12 NOTE — Evaluation (Signed)
Clinical/Bedside Swallow Evaluation Patient Details  Name: [facility] Medina MRN: [id number] Date of Birth: [birth year]  Today's Date: 03/12/2013 Time: 1025-1035 SLP Time Calculation (min): 10 min  Past Medical History:  Past Medical History  Diagnosis Date  . Dementia   . Anxiety   . Paranoia   . Hypertension   . Hypercholesterolemia   . Diabetes mellitus    Past Surgical History: History reviewed. No pertinent past surgical history. HPI:  79 y/o female with dementia, DM, paranoia who lives in a SNF was admitted on 11/13 in the early AM on transfer from the [place] ED for confusion and a possible GI bleed.  CXR Bilateral lower lobe infiltrates stable on the left and slightly worse on the right with continued mild vascular congestion. Pulmonary edema versus bilateral pneumonitis again considered.   Assessment / Plan / Recommendation Clinical Impression  No oropharyngeal dysfunction detected during swallow assessment.  Aspiration risk appears fairly low per chart review (no CVA, no prior pna), pt. subjective report.  Question if pt. may have aspirated emesis when vomited?  Would not recommend an objective assessment at this point.  If she continues to have future admissions for pna, objective testing may be beneficial at that time.  Recommend continue regular texture diet and thin liquids.  No f/u at this time.    Aspiration Risk  Mild    Diet Recommendation Regular;Thin liquid   Liquid Administration via: Straw;Cup Medication Administration: Whole meds with liquid Supervision: Patient able to self feed Compensations: Slow rate;Small sips/bites Postural Changes and/or Swallow Maneuvers: Seated upright 90 degrees    Other  Recommendations Oral Care Recommendations: Oral care BID   Follow Up Recommendations  None    Frequency and Duration        Pertinent Vitals/Pain n/a      Swallow Study Prior Functional Status  Type of Home: Skilled Nursing Facility Available Help  at Discharge: Skilled Nursing Facility       Oral/Motor/Sensory Function Overall Oral Motor/Sensory Function: Impaired Labial ROM: Within Functional Limits Labial Symmetry: Abnormal symmetry left Labial Strength: Within Functional Limits Lingual ROM: Within Functional Limits Lingual Symmetry: Within Functional Limits Lingual Strength: Within Functional Limits Facial ROM: Within Functional Limits Facial Symmetry: Within Functional Limits Facial Strength: Within Functional Limits Velum: Within Functional Limits Mandible: Within Functional Limits   Ice Chips Ice chips: Not tested   Thin Liquid Thin Liquid: Within functional limits Presentation: Cup;Straw    Nectar Thick Nectar Thick Liquid: Not tested   Honey Thick Honey Thick Liquid: Not tested   Puree Puree: Within functional limits   Solid   GO    Solid: Within functional limits       Royce Macadamia M.Ed [employer] [phone]  03/12/2013

## 2013-03-12 NOTE — Clinical Social Work Placement (Signed)
Clinical Social Work Department CLINICAL SOCIAL WORK PLACEMENT NOTE 03/12/2013  Patient:  [facility] Medina, Yolanda Medina  Account Number:  [id number] Admit date:  03/06/2013  Clinical Social Worker:  Lavell Luster  Date/time:  03/12/2013 04:30 PM  Clinical Social Work is seeking post-discharge placement for this patient at the following level of care:   SKILLED NURSING   (*CSW will update this form in Epic as items are completed)   03/12/2013  Patient/family provided with [facility] Department of Clinical Social Work's list of facilities offering this level of care within the geographic area requested by the patient (or if unable, by the patient's family).  03/12/2013  Patient/family informed of their freedom to choose among providers that offer the needed level of care, that participate in Medicare, Medicaid or managed care program needed by the patient, have an available bed and are willing to accept the patient.  03/12/2013  Patient/family informed of [facility]' ownership interest in [facility], as well as of the fact that they are under no obligation to receive care at this facility.  PASARR submitted to EDS on 03/12/2013 PASARR number received from EDS on 03/12/2013  FL2 transmitted to all facilities in geographic area requested by pt/family on  03/12/2013 FL2 transmitted to all facilities within larger geographic area on   Patient informed that his/her managed care company has contracts with or will negotiate with  certain facilities, including the following:     Patient/family informed of bed offers received:   Patient chooses bed at  Physician recommends and patient chooses bed at    Patient to be transferred to  on   Patient to be transferred to facility by   The following physician request were entered in Epic:   Additional Comments:   Roddie Mc, [place], [place], [phone]

## 2013-03-12 NOTE — Progress Notes (Signed)
Patient refused CPAP for the night. Patient on 2lpm with SP02=96%

## 2013-03-12 NOTE — Clinical Social Work Psychosocial (Signed)
Clinical Social Work Department BRIEF PSYCHOSOCIAL ASSESSMENT 03/12/2013  Patient:  [facility] Medina, Yolanda Medina     Account Number:  [id number]     Admit date:  03/06/2013  Clinical Social Worker:  Lavell Luster  Date/Time:  03/12/2013 04:15 PM  Referred by:  Physician  Date Referred:  03/12/2013 Referred for  SNF Placement   Other Referral:   Interview type:  Patient Other interview type:   Patient and son were interviewed.    PSYCHOSOCIAL DATA Living Status:  FAMILY Admitted from facility:   Level of care:   Primary support name:  [facility] [phone] and [facility] [phone]   [phone] Primary support relationship to patient:  CHILD, ADULT Degree of support available:   Support is good.    CURRENT CONCERNS Current Concerns  Post-Acute Placement   Other Concerns:    SOCIAL WORK ASSESSMENT / PLAN CSW met with patient to discuss recommendation for SNF placement. Initially patient was adamant about returning home to live with son Yolanda Medina. [facility] Medina and respiratory therapist were able to convince patient to go to SNF for rehab. Patient was crying and upset during second contact with CSW regarding SNF placement. Patient states that her son Yolanda Medina is a polysubstance user and that he depends on her for a place to stay and for other needs. Patient explained that this is why she did not want to initially go to SNF. Patient would like to be placed at [facility] but understands that this cannot be guaranteed. Patient disclosed that there is tension between the two brothers as Yolanda Medina does not want [facility] Medina living with the patient. Patient understands that her DC will take place on 03/13/13 and that she will need to make a decision among the facilities that have bed availability on 03/13/13.   Assessment/plan status:  Psychosocial Support/Ongoing Assessment of Needs Other assessment/ plan:   Complete FL2, Fax, PASRR   Information/referral to community resources:   CSW contact  information and SNF list given to patient.    PATIENT'S/FAMILY'S RESPONSE TO PLAN OF CARE: Patient is agreeable to SNF placement. Patient was pleasant and appreciative of CSW contact. Patient awaits bed offers.      Roddie Mc, [place], [place], [phone]

## 2013-03-12 NOTE — Evaluation (Signed)
Physical Therapy Evaluation Patient Details Name: [facility] Medina MRN: [id number] DOB: 1933/06/24 Today's Date: 03/12/2013 Time: 4098-1191 PT Time Calculation (min): 16 min  PT Assessment / Plan / Recommendation History of Present Illness  Pt is a 79 y.o. adm from SNF and transferred from [facility] with possible GI bleed   Clinical Impression  Pt adm due to above. Presents with decreased independence with functional mobility secondary to deficits listed below (see PT problem list). Will benefit from skilled PT to maximize functional mobility. Unclear of PLOF secondary to history of decreased cognition. Pt is oriented but is a poor historian regarding situation and PLOF. Will recommend postacute rehab upon acute D/C back to SNF.     PT Assessment  Patient needs continued PT services    Follow Up Recommendations  SNF;Supervision/Assistance - 24 hour    Does the patient have the potential to tolerate intense rehabilitation      Barriers to Discharge        Equipment Recommendations  None recommended by PT    Recommendations for Other Services OT consult   Frequency Min 2X/week    Precautions / Restrictions Precautions Precautions: Fall Restrictions Weight Bearing Restrictions: No   Pertinent Vitals/Pain No c/o pain. See VSS       Mobility  Bed Mobility Bed Mobility: Supine to Sit;Sitting - Scoot to Edge of Bed Supine to Sit: 3: Mod assist;HOB elevated;With rails Sitting - Scoot to Edge of Bed: 5: Supervision Details for Bed Mobility Assistance: pt required (A) to bring shoulders/trunk to sitting position EOB; max multimodal cues for hand placement and sequencing and use of handrails for bed mobility  Transfers Transfers: Sit to Stand;Stand to Sit;Stand Pivot Transfers Sit to Stand: 3: Mod assist;From bed;With upper extremity assist Stand to Sit: To chair/3-in-1;With upper extremity assist;With armrests;4: Min assist Stand Pivot Transfers: 4: Min  assist Details for Transfer Assistance: pt with difficulty powering up to standing position; required cues and (A) to reach upright standing position; pt tends to push posteriorly; cues for sequencing with transfers and (A) to maintain balance; pt unsteady  Ambulation/Gait Ambulation/Gait Assistance: Not tested (comment) Stairs: No Wheelchair Mobility Wheelchair Mobility: No    Exercises General Exercises - Lower Extremity Ankle Circles/Pumps: AROM;Both;10 reps   PT Diagnosis: Difficulty walking  PT Problem List: Decreased strength;Decreased range of motion;Decreased activity tolerance;Decreased balance;Decreased mobility;Decreased knowledge of use of DME;Decreased safety awareness;Decreased cognition PT Treatment Interventions: DME instruction;Gait training;Functional mobility training;Therapeutic activities;Therapeutic exercise;Balance training;Neuromuscular re-education;Patient/family education     PT Goals(Current goals can be found in the care plan section) Acute Rehab PT Goals Patient Stated Goal: to go home PT Goal Formulation: With patient Time For Goal Achievement: 03/26/13 Potential to Achieve Goals: Fair  Visit Information  Last PT Received On: 03/12/13 Assistance Needed: +1 History of Present Illness: Pt is a 79 y.o. adm from SNF and transferred from [facility] with possible GI bleed        Prior Functioning  Home Living Family/patient expects to be discharged to:: Skilled nursing facility Available Help at Discharge: Skilled Nursing Facility Type of Home: Skilled Nursing Facility Home Equipment: Environmental consultant - 2 wheels Additional Comments: pt lives at SNF per admission note Prior Function Level of Independence: Needs assistance Gait / Transfers Assistance Needed: pt reports she was not walking with AD; unclear if this is correct secondary to history of cognitive impairments Comments: pt is a poor historian  Communication Communication: No difficulties     Cognition  Cognition Arousal/Alertness: Awake/alert Behavior During  Therapy: WFL for tasks assessed/performed Overall Cognitive Status: History of cognitive impairments - at baseline    Extremity/Trunk Assessment Upper Extremity Assessment Upper Extremity Assessment: Defer to OT evaluation Lower Extremity Assessment Lower Extremity Assessment: Difficult to assess due to impaired cognition Cervical / Trunk Assessment Cervical / Trunk Assessment: Normal   Balance Balance Balance Assessed: Yes Static Sitting Balance Static Sitting - Balance Support: Feet unsupported;Bilateral upper extremity supported Static Sitting - Level of Assistance: 5: Stand by assistance Static Sitting - Comment/# of Minutes: tolerated ~4 min   End of Session PT - End of Session Equipment Utilized During Treatment: Gait belt;Oxygen Activity Tolerance: Patient tolerated treatment well Patient left: in chair;with call bell/phone within reach;with chair alarm set Nurse Communication: Mobility status  GP     Donell Sievert, [place] [phone] 03/12/2013, 8:31 AM

## 2013-03-12 NOTE — Progress Notes (Signed)
[facility] Progress Note     Yolanda Medina BJY:[id number] DOB: 03-29-1934 DOA: 03/06/2013 PCP: Provider Not In System  Admit HPI / Brief Narrative: 79 y/o female with dementia who lives in a SNF was admitted on 11/13 in the early AM on transfer from the [place] ED for confusion and a possible GI bleed. She was confused at presentation and no family was available so history was obtained by chart review. It appeared she came in from her nursing home on 11/12 confused, hypoxemic and developed shock. In the ED she vomited "coffee grounds" which were heme positive. She was found to be in acute renal failure.  SIGNIFICANT EVENTS / STUDIES:  11/12 CT head [facility]): NAICP  11/12 CT abdomen: no free air, small amount of portal venous gas  11/13 Off vasopressors.  11/14 Poorly oriented. No distress. Transfer to SDU.  11/15 [facility] assumed care   Assessment/Plan:  Severe sepsis - UTI and b/l PNA on CT chest - Question Proctatits on Ct? Pt having loose stools- check stool studies  Portal venous gas - exam benign - repeat CT negative for this  E coli UTI / pyelonephritis   - cont current antibiotics due to above mentioned CT findings of PNA and possible Proctitis  PNA- b/l bases - ronchi today may be significant for underlying Pneumonia- follow for now- question of aspiration- will obtain swallow eval  HTN BP remains elevated - adjust treatment plan and follow  Tachycardia HR better controlled today  Acute kidney injury - resolved crt has normalized  Mild anemia without acute blood loss Hgb is improving - no evidence of acute blood loss  DM2 CBG well controlled  Hypokalemia Replace and follow trend - check magnesium level  Acute encephalopathy / Agitation / Baseline Dementia / polypharmacy The patient appears to be much more stable from a mental status standpoint today  Code Status: FULL Family Communication:-  Dr Butler Denmark spoke w/ son, Yolanda Medina on 03-11-13 I called  and updated son Yolanda Medina on 03/12/2013 and his wife, I was informed that patient is living with Yolanda Medina who is a drug at it and frequently goes to jail. They understand that patient is refusing placement. And that she is competent to make her own decision of going to [place].   Disposition Plan: transfer to med/surg  Consultants: PCCM >> [name] Gen Surgery  Procedures: none  Antibiotics: Zosyn 11/12 >>   DVT prophylaxis: lovenox  HPI/Subjective: The patient is more alert - per RN has been pocketing pills- tolerating liquids but significant ronchi  Objective: Blood pressure 132/69, pulse 87, temperature 97.9 F (36.6 C), temperature source Oral, resp. rate 18, height 5\' 3"  (1.6 m), weight 63.1 kg (139 lb 1.8 oz), SpO2 97.00%.  Intake/Output Summary (Last 24 hours) at 03/12/13 1439 Last data filed at 03/12/13 1000  Gross per 24 hour  Intake 1712.5 ml  Output   1250 ml  Net  462.5 ml   Exam: General: No acute respiratory distress at rest - currently sitting in large amount of stool ans is unaware of it Lungs: mild b/l ronchi Cardiovascular: Regular rate and rhythm without murmur gallop or rub normal S1 and S2 Abdomen: Nontender, nondistended, soft, bowel sounds positive, no rebound, no ascites, no appreciable mass Extremities: No significant cyanosis, clubbing, edema bilateral lower extremities  Data Reviewed: Basic Metabolic Panel:  Recent Labs Lab 03/06/13 0245 03/07/13 0435 03/08/13 0325 03/10/13 0513 03/11/13 0725 03/12/13 0541  NA 138 145 141 138 138 136  K 4.9 4.4 4.0  2.7* 3.6 3.4*  CL 102 112 108 101 104 102  CO2 24 26 26 26 25 24   GLUCOSE 122* 87 105* 88 87 95  BUN 47* 40* 27* 11 8 7   CREATININE 2.91* 1.42* 0.73 0.61 0.64 0.68  CALCIUM 6.8* 7.3* 8.5 8.3* 8.1* 8.2*  MG 1.6  --   --  1.5 1.5  --   PHOS 6.5*  --   --   --   --   --    Liver Function Tests:  Recent Labs Lab 03/06/13 0245 03/07/13 0435  AST 56* 47*  ALT 17 17  ALKPHOS 51 53  BILITOT  0.2* 0.2*  PROT 5.1* 4.7*  ALBUMIN 2.8* 2.4*    Recent Labs Lab 03/06/13 0245  LIPASE 26  AMYLASE 192*   CBC:  Recent Labs Lab 03/06/13 0245 03/06/13 1440 03/07/13 0435 03/08/13 0325 03/10/13 0513 03/12/13 0541  WBC 11.8* 7.3 8.8 9.9 6.8 5.7  NEUTROABS 10.0*  --   --   --   --   --   HGB 9.6* 9.4* 8.5* 9.6* 9.6* 8.9*  HCT 29.7* 27.7* 26.0* 28.4* 27.5* 25.8*  MCV 94.6 93.0 93.2 92.2 88.4 89.6  PLT 221 166 153 159 185 198   Cardiac Enzymes:  Recent Labs Lab 03/06/13 0201 03/06/13 0810 03/06/13 1440  TROPONINI <0.30 <0.30 <0.30   BNP (last 3 results)  Recent Labs  03/06/13 0201  PROBNP 788.5*   CBG:  Recent Labs Lab 03/11/13 1243 03/11/13 1806 03/11/13 2121 03/12/13 0740 03/12/13 1204  GLUCAP 109* 105* 97 97 104*    Recent Results (from the past 240 hour(s))  MRSA PCR SCREENING     Status: None   Collection Time    03/06/13  1:44 AM      Result Value Range Status   MRSA by PCR NEGATIVE  NEGATIVE Final   Comment:            The GeneXpert MRSA Assay (FDA     approved for NASAL specimens     only), is one component of a     comprehensive MRSA colonization     surveillance program. It is not     intended to diagnose MRSA     infection nor to guide or     monitor treatment for     MRSA infections.  CULTURE, BLOOD (ROUTINE X 2)     Status: None   Collection Time    03/06/13  2:55 AM      Result Value Range Status   Specimen Description BLOOD LEFT ARM   Final   Special Requests BOTTLES DRAWN AEROBIC ONLY [facility]   Final   Culture  Setup Time     Final   Value: 03/06/2013 09:41     Performed at [facility]   Culture     Final   Value: NO GROWTH 5 DAYS     Performed at [facility]   Report Status 03/12/2013 FINAL   Final  CULTURE, BLOOD (ROUTINE X 2)     Status: None   Collection Time    03/06/13  5:00 AM      Result Value Range Status   Specimen Description BLOOD RIGHT RADIAL A-LINE   Final   Special Requests BOTTLES DRAWN  AEROBIC AND ANAEROBIC 10CC EACH   Final   Culture  Setup Time     Final   Value: 03/06/2013 11:25     Performed at [facility]  Final   Value: NO GROWTH 5 DAYS     Performed at [facility]   Report Status 03/12/2013 FINAL   Final  URINE CULTURE     Status: None   Collection Time    03/06/13  5:41 AM      Result Value Range Status   Specimen Description URINE, CATHETERIZED   Final   Special Requests Normal   Final   Culture  Setup Time     Final   Value: 03/06/2013 18:21     Performed at [facility]   Culture     Final   Value: >=100,000 COLONIES/mL ESCHERICHIA COLI     Performed at [facility]   Report Status 03/08/2013 FINAL   Final   Organism ID, Bacteria ESCHERICHIA COLI   Final  CLOSTRIDIUM DIFFICILE BY PCR     Status: None   Collection Time    03/11/13  2:57 PM      Result Value Range Status   C difficile by pcr NEGATIVE  NEGATIVE Final     Studies:  Recent x-ray studies have been reviewed in detail by the Attending Physician  Scheduled Meds:  Scheduled Meds: . cefUROXime  500 mg Oral BID WC  . enoxaparin (LOVENOX) injection  40 mg Subcutaneous Q24H  . insulin aspart  0-5 Units Subcutaneous QHS  . insulin aspart  0-9 Units Subcutaneous TID WC  . labetalol  100 mg Oral BID  . levalbuterol  0.63 mg Nebulization Q6H  . pantoprazole  40 mg Oral Q1200  . piperacillin-tazobactam (ZOSYN)  IV  3.375 g Intravenous Q8H  . traZODone  100 mg Oral QHS    Time spent on care of this patient: 35 mins   Leroy Sea, MD  [facility] Office  [phone] Pager - Text Page per Loretha Stapler as per below:  On-Call/Text Page:      [email or web address]      password [email or web address]  If 7PM-7AM, please contact night-coverage [email or web address] Password [email or web address] 03/12/2013, 2:39 PM   LOS: 6 days

## 2013-03-13 LAB — GLUCOSE, CAPILLARY: Glucose-Capillary: 106 mg/dL — ABNORMAL HIGH (ref 70–99)

## 2013-03-13 LAB — URINE CULTURE: Colony Count: 25000

## 2013-03-13 MED ORDER — METRONIDAZOLE 500 MG PO TABS: 500.0000 mg | ORAL_TABLET | Freq: Three times a day (TID) | ORAL | Status: DC

## 2013-03-13 MED ORDER — LABETALOL HCL 100 MG PO TABS: 100.0000 mg | ORAL_TABLET | Freq: Two times a day (BID) | ORAL | Status: AC

## 2013-03-13 MED ORDER — OMEPRAZOLE 40 MG PO CPDR: 40.0000 mg | DELAYED_RELEASE_CAPSULE | Freq: Every day | ORAL | Status: AC

## 2013-03-13 MED ORDER — ALPRAZOLAM 0.5 MG PO TABS: 0.5000 mg | ORAL_TABLET | Freq: Two times a day (BID) | ORAL | Status: AC | PRN

## 2013-03-13 MED ORDER — LISINOPRIL 10 MG PO TABS: 10.0000 mg | ORAL_TABLET | Freq: Every day | ORAL | Status: AC

## 2013-03-13 MED ORDER — TRAMADOL HCL 50 MG PO TABS: 50.0000 mg | ORAL_TABLET | Freq: Two times a day (BID) | ORAL | Status: AC | PRN

## 2013-03-13 MED ORDER — CEFUROXIME AXETIL 500 MG PO TABS: 500.0000 mg | ORAL_TABLET | Freq: Two times a day (BID) | ORAL | Status: DC

## 2013-03-13 MED ORDER — ALPRAZOLAM 0.5 MG PO TABS: 0.5000 mg | ORAL_TABLET | Freq: Every evening | ORAL | Status: DC | PRN

## 2013-03-13 NOTE — Progress Notes (Signed)
Yolanda Medina to be D/C'd Skilled nursing facility per MD order.  Discussed with the patient and all questions fully answered.    Medication List    STOP taking these medications       chlordiazePOXIDE 25 MG capsule  Commonly known as:  LIBRIUM     oxyCODONE-acetaminophen 10-325 MG per tablet  Commonly known as:  PERCOCET     temazepam 30 MG capsule  Commonly known as:  RESTORIL     zolpidem 10 MG tablet  Commonly known as:  AMBIEN      TAKE these medications       albuterol 108 (90 BASE) MCG/ACT inhaler  Commonly known as:  PROVENTIL HFA;VENTOLIN HFA  Inhale 1 puff into the lungs every 6 (six) hours as needed for wheezing or shortness of breath.     ALPRAZolam 0.5 MG tablet  Commonly known as:  XANAX  Take 1 tablet (0.5 mg total) by mouth 2 (two) times daily as needed for anxiety.     cefUROXime 500 MG tablet  Commonly known as:  CEFTIN  Take 1 tablet (500 mg total) by mouth 2 (two) times daily with a meal. 5 more days     chlorzoxazone 500 MG tablet  Commonly known as:  PARAFON  Take 500 mg by mouth 4 (four) times daily as needed for muscle spasms.     citalopram 40 MG tablet  Commonly known as:  CELEXA  Take 80 mg by mouth daily.     gabapentin 300 MG capsule  Commonly known as:  NEURONTIN  Take 300 mg by mouth 3 (three) times daily.     labetalol 100 MG tablet  Commonly known as:  NORMODYNE  Take 1 tablet (100 mg total) by mouth 2 (two) times daily.     lisinopril 10 MG tablet  Commonly known as:  PRINIVIL,ZESTRIL  Take 1 tablet (10 mg total) by mouth daily.     metFORMIN 850 MG tablet  Commonly known as:  GLUCOPHAGE  Take 850 mg by mouth 2 (two) times daily with a meal.     metroNIDAZOLE 500 MG tablet  Commonly known as:  FLAGYL  Take 1 tablet (500 mg total) by mouth 3 (three) times daily. 5 more days     omeprazole 40 MG capsule  Commonly known as:  PRILOSEC  Take 1 capsule (40 mg total) by mouth daily.     pravastatin 20 MG tablet   Commonly known as:  PRAVACHOL  Take 20 mg by mouth daily.     traMADol 50 MG tablet  Commonly known as:  ULTRAM  Take 1 tablet (50 mg total) by mouth every 12 (twelve) hours as needed for moderate pain.        VVS. IV catheter discontinued intact. Site without signs and symptoms of complications. Dressing and pressure applied.  Pt taken by transport to [facility].   Aldean Ast 03/13/2013 3:13 PM

## 2013-03-13 NOTE — Progress Notes (Signed)
Report given to [facility] at [facility].

## 2013-03-13 NOTE — Clinical Social Work Note (Signed)
Patient states that she wants to go to [facility] instead of [facility] so that she will be in closer proximity to both sons. Both sons live in [place]. Patient reports serious tension between the two sons and states that oldest son [name] wanted [facility] because he doesn't want the other son to be able to visit patient. CSW explained to the patient that the decision is hers. Patient will not be discharged to [facility]. CSW attempted to contact son [name], CSW left message.  Roddie Mc, [place], [place], [phone]

## 2013-03-13 NOTE — Discharge Summary (Signed)
[facility]                                                                                   Yolanda Medina, is a 79 y.o. female  DOB 05-26-1933  MRN [id number].  Admission date:  03/06/2013  Admitting Physician  Storm Frisk, MD  Discharge Date:  03/13/2013   Primary MD  Provider Not In System  Recommendations for primary care physician for things to follow:      Admission Diagnosis  PNEUMONIA HYPOXIA  Discharge Diagnosis   Sepsis from UTI  Principal Problem:   Septic shock(785.52) Active Problems:   AKI (acute kidney injury)   Encephalopathy acute   Pneumobilia   UTI (lower urinary tract infection)   Pneumonia      Past Medical History  Diagnosis Date  . Dementia   . Anxiety   . Paranoia   . Hypertension   . Hypercholesterolemia   . Diabetes mellitus     History reviewed. No pertinent past surgical history.   Discharge Condition: Stable       Follow-up Information   Follow up with PCP or SNF MD. Schedule an appointment as soon as possible for a visit in 4 days.      Follow up with Stan Head, MD. Schedule an appointment as soon as possible for a visit in 1 week.   Specialty:  Gastroenterology   Contact information:   [address] [place] [address] [phone]         Consults obtained -    Discharge Medications      Medication List    STOP taking these medications       chlordiazePOXIDE 25 MG capsule  Commonly known as:  LIBRIUM     oxyCODONE-acetaminophen 10-325 MG per tablet  Commonly known as:  PERCOCET     temazepam 30 MG capsule  Commonly known as:  RESTORIL     zolpidem 10 MG tablet  Commonly known as:  AMBIEN      TAKE these medications       albuterol 108 (90 BASE) MCG/ACT inhaler  Commonly known as:  PROVENTIL HFA;VENTOLIN HFA  Inhale 1 puff into the lungs every 6 (six) hours as needed for wheezing or shortness of breath.     ALPRAZolam 0.5 MG tablet  Commonly known as:  XANAX  Take 1  tablet (0.5 mg total) by mouth 2 (two) times daily as needed for anxiety.     cefUROXime 500 MG tablet  Commonly known as:  CEFTIN  Take 1 tablet (500 mg total) by mouth 2 (two) times daily with a meal. 5 more days     chlorzoxazone 500 MG tablet  Commonly known as:  PARAFON  Take 500 mg by mouth 4 (four) times daily as needed for muscle spasms.     citalopram 40 MG tablet  Commonly known as:  CELEXA  Take 80 mg by mouth daily.     gabapentin 300 MG capsule  Commonly known as:  NEURONTIN  Take 300 mg by mouth 3 (three) times daily.     labetalol 100 MG tablet  Commonly known as:  NORMODYNE  Take  1 tablet (100 mg total) by mouth 2 (two) times daily.     lisinopril 10 MG tablet  Commonly known as:  PRINIVIL,ZESTRIL  Take 1 tablet (10 mg total) by mouth daily.     metFORMIN 850 MG tablet  Commonly known as:  GLUCOPHAGE  Take 850 mg by mouth 2 (two) times daily with a meal.     metroNIDAZOLE 500 MG tablet  Commonly known as:  FLAGYL  Take 1 tablet (500 mg total) by mouth 3 (three) times daily. 5 more days     omeprazole 40 MG capsule  Commonly known as:  PRILOSEC  Take 1 capsule (40 mg total) by mouth daily.     pravastatin 20 MG tablet  Commonly known as:  PRAVACHOL  Take 20 mg by mouth daily.     traMADol 50 MG tablet  Commonly known as:  ULTRAM  Take 1 tablet (50 mg total) by mouth every 12 (twelve) hours as needed for moderate pain.         Diet and Activity recommendation: See Discharge Instructions below   Discharge Instructions     Follow with Primary MD or the M.D. at SNF in 4 days   Get CBC, CMP, checked 4 days by Primary MD and again as instructed by your Primary MD.  Get Medicines reviewed and adjusted.  Please request your Prim.MD to go over all Hospital Tests and Procedure/Radiological results at the follow up, please get all Hospital records sent to your Prim MD by signing hospital release before you go home.  Activity: As tolerated with Full  fall precautions use walker/cane & assistance as needed   Diet:  Heart healthy-low carbohydrate diet with full assistance and Aspiration precautions.  For Heart failure patients - Check your Weight same time everyday, if you gain over 2 pounds, or you develop in leg swelling, experience more shortness of breath or chest pain, call your Primary MD immediately. Follow Cardiac Low Salt Diet and 1.8 lit/day fluid restriction.  Disposition SNF  If you experience worsening of your admission symptoms, develop shortness of breath, life threatening emergency, suicidal or homicidal thoughts you must seek medical attention immediately by calling 911 or calling your MD immediately  if symptoms less severe.  You Must read complete instructions/literature along with all the possible adverse reactions/side effects for all the Medicines you take and that have been prescribed to you. Take any new Medicines after you have completely understood and accpet all the possible adverse reactions/side effects.   Do not drive and provide baby sitting services if your were admitted for syncope or siezures until you have seen by Primary MD or a Neurologist and advised to do so again.  Do not drive when taking Pain medications.    Do not take more than prescribed Pain, Sleep and Anxiety Medications  Special Instructions: If you have smoked or chewed Tobacco  in the last 2 yrs please stop smoking, stop any regular Alcohol  and or any Recreational drug use.  Wear Seat belts while driving.   Please note  You were cared for by a hospitalist during your hospital stay. If you have any questions about your discharge medications or the care you received while you were in the hospital after you are discharged, you can call the unit and asked to speak with the hospitalist on call if the hospitalist that took care of you is not available. Once you are discharged, your primary care physician will handle any further medical issues.  Please note that NO REFILLS for any discharge medications will be authorized once you are discharged, as it is imperative that you return to your primary care physician (or establish a relationship with a primary care physician if you do not have one) for your aftercare needs so that they can reassess your need for medications and monitor your lab values.    Major procedures and Radiology Reports - PLEASE review detailed and final reports for all details, in brief -       Ct Abdomen Pelvis W Contrast  03/11/2013   CLINICAL DATA:  Evaluate for portal venous gas, which was reportedly seen on outside CT imaging  EXAM: CT ABDOMEN AND PELVIS WITH CONTRAST  TECHNIQUE: Multidetector CT imaging of the abdomen and pelvis was performed using the standard protocol following bolus administration of intravenous contrast.  CONTRAST:  OMNIPAQUE IOHEXOL 300 MG/ML  SOLN  COMPARISON:  None.  FINDINGS: BODY WALL: Medication injection side in the lower right abdomen. Mild body wall edema.  LOWER CHEST: Small to moderate bilateral pleural effusions which are layering. There is patchy airspace disease in the lower lungs.  ABDOMEN/PELVIS:  Liver: No focal abnormality.  Biliary: No evidence of biliary obstruction or stone.  Pancreas: Unremarkable.  Spleen: Unremarkable.  Adrenals: Unremarkable.  Kidneys and ureters: Symmetric mid fullness of the urinary collecting systems. No stone.  Bladder: Drained by Foley catheter.  Reproductive: Hysterectomy.  Bowel: Circumferential thickening of the rectal wall, with fairly extensive mesorectal edema. There is moderate distention of the rectum by stool, measuring 6 cm in diameter. No bowel obstruction. Appendectomy.  Retroperitoneum: No mass or adenopathy.  Peritoneum: No free fluid or gas.  Vascular: No portal venous gas seen currently.  OSSEOUS: Left-sided laminotomy at L5-S1.  No acute osseous findings.  IMPRESSION: 1. No portal venous gas seen currently. 2. Proctitis and  mesorectal edema. This could be infectious, inflammatory, or pressure related (stercoral colitis). 3. Bilateral pneumonia with small to moderate pleural effusions.   Electronically Signed   By: Tiburcio Pea M.D.   On: 03/11/2013 01:27   Dg Chest Port 1 View  03/12/2013   CLINICAL DATA:  Shortness of breath possible aspiration  EXAM: PORTABLE CHEST - 1 VIEW  COMPARISON:  03/08/2013  FINDINGS: Heart size is upper normal. There is infiltrate bilaterally in the lower lung zones. When compared with the prior study, right-sided infiltrate is worse. Left-sided consolidation is similar. Vascular congestion is stable.  IMPRESSION: Bilateral lower lobe infiltrates stable on the left and slightly worse on the right with continued mild vascular congestion. Pulmonary edema versus bilateral pneumonitis again considered.   Electronically Signed   By: Esperanza Heir M.D.   On: 03/12/2013 08:40   Dg Chest Port 1 View  03/08/2013   CLINICAL DATA:  Dyspnea.  EXAM: PORTABLE CHEST - 1 VIEW  COMPARISON:  Chest radiograph from 03/07/2013  FINDINGS: The lungs are mildly hypoexpanded. Vascular congestion is noted, with bibasilar airspace opacities. This may reflect mildly worsened pulmonary edema or possibly pneumonia. A small left pleural effusion is seen. No pneumothorax is identified.  The cardiomediastinal silhouette is mildly enlarged. No acute osseous abnormalities are seen.  IMPRESSION: Lungs mildly hypoexpanded. Vascular congestion and mild cardiomegaly, with bibasilar airspace opacities, may reflect mildly worsened pulmonary edema or possibly pneumonia. Small left pleural effusion seen.   Electronically Signed   By: Roanna Raider M.D.   On: 03/08/2013 23:05   Dg Chest Port 1 View  03/07/2013   CLINICAL DATA:  Respiratory  failure  EXAM: PORTABLE CHEST - 1 VIEW  COMPARISON:  03/06/2013  FINDINGS: Cardiac shadow is stable. The nasogastric catheter is been removed in the interval. Mild vascular congestion is again  seen. Persistent left lower lobe infiltrative changes are seen with associated effusion.  IMPRESSION: No change from the prior exam.   Electronically Signed   By: Alcide Clever M.D.   On: 03/07/2013 07:39   Dg Chest Port 1 View  03/06/2013   CLINICAL DATA:  Hypoxia.  EXAM: PORTABLE CHEST - 1 VIEW  COMPARISON:  None.  FINDINGS: The lungs are mildly hypoexpanded. Retrocardiac airspace opacification raises concern for pneumonia. Mild vascular crowding and vascular congestion are seen. A small left pleural effusion is suspected. No pneumothorax is identified.  The cardiomediastinal silhouette is borderline normal in size. Calcification is noted within the aortic arch. No acute osseous abnormalities are seen. The patient is status post left-sided rotator cuff repair. An enteric tube is noted ending overlying the antrum of the stomach.  IMPRESSION: Lungs mildly hypoexpanded. Retrocardiac airspace opacification raises concern for pneumonia; suspect small left pleural effusion. Mild vascular congestion seen.   Electronically Signed   By: Roanna Raider M.D.   On: 03/06/2013 02:43   Dg Abd Portable 1v  03/08/2013   CLINICAL DATA:  Abdominal pain  EXAM: PORTABLE ABDOMEN - 1 VIEW  COMPARISON:  None.  FINDINGS: Supine portable abdominal view obtained. Scattered air throughout the small and large bowel. Negative for obstruction or ileus. No abnormal calcifications. Degenerative changes of the spine and hips. Bones are osteopenic.  IMPRESSION: Negative for obstruction or ileus.   Electronically Signed   By: Ruel Favors M.D.   On: 03/08/2013 10:01    Micro Results      Recent Results (from the past 240 hour(s))  MRSA PCR SCREENING     Status: None   Collection Time    03/06/13  1:44 AM      Result Value Range Status   MRSA by PCR NEGATIVE  NEGATIVE Final   Comment:            The GeneXpert MRSA Assay (FDA     approved for NASAL specimens     only), is one component of a     comprehensive MRSA colonization      surveillance program. It is not     intended to diagnose MRSA     infection nor to guide or     monitor treatment for     MRSA infections.  CULTURE, BLOOD (ROUTINE X 2)     Status: None   Collection Time    03/06/13  2:55 AM      Result Value Range Status   Specimen Description BLOOD LEFT ARM   Final   Special Requests BOTTLES DRAWN AEROBIC ONLY [facility]   Final   Culture  Setup Time     Final   Value: 03/06/2013 09:41     Performed at [facility]   Culture     Final   Value: NO GROWTH 5 DAYS     Performed at [facility]   Report Status 03/12/2013 FINAL   Final  CULTURE, BLOOD (ROUTINE X 2)     Status: None   Collection Time    03/06/13  5:00 AM      Result Value Range Status   Specimen Description BLOOD RIGHT RADIAL A-LINE   Final   Special Requests BOTTLES DRAWN AEROBIC AND ANAEROBIC 10CC EACH   Final  Culture  Setup Time     Final   Value: 03/06/2013 11:25     Performed at [facility]   Culture     Final   Value: NO GROWTH 5 DAYS     Performed at [facility]   Report Status 03/12/2013 FINAL   Final  URINE CULTURE     Status: None   Collection Time    03/06/13  5:41 AM      Result Value Range Status   Specimen Description URINE, CATHETERIZED   Final   Special Requests Normal   Final   Culture  Setup Time     Final   Value: 03/06/2013 18:21     Performed at [facility]   Culture     Final   Value: >=100,000 COLONIES/mL ESCHERICHIA COLI     Performed at [facility]   Report Status 03/08/2013 FINAL   Final   Organism ID, Bacteria ESCHERICHIA COLI   Final  CLOSTRIDIUM DIFFICILE BY PCR     Status: None   Collection Time    03/11/13  2:57 PM      Result Value Range Status   C difficile by pcr NEGATIVE  NEGATIVE Final     History of present illness and  Hospital Course:     Kindly see H&P for history of present illness and admission details, please review complete Labs, Consult reports and Test reports for all  details in brief Yolanda Medina, is a 79 y.o. female, patient with history of  dementia, type 2 diabetes mellitus hypertension, anxiety was admitted to the hospital with chief complaints of confusion and possible blood in stool. Her workup was consistent of sepsis from UTI, possible proctitis on CT scan, acute renal failure due to dehydration and prerenal azotemia, acute toxic encephalopathy which has resolved, there was a question of upper GI bleed in the ER, however subsequently on the floor and in the ICU patient did not have any evidence of the same.     Patient was initially transferred from [facility] due to sepsis and admitted to [facility] cone ICU under the care of pulmonary critical care, she was seen by general surgery as there was evidence of proctitis on CT scan and questionable portal venous gas which was thought to be a false-positive result based on subsequent imaging. General surgery recommended medical treatment for her proctitis with antibiotics and bowel rest which patient responded well, she is now back to her baseline has no abdominal pain or discomfort, no nausea vomiting and no anemia suggestive of ongoing blood loss. She will be discharged on a more days of antibiotics along with PPI on board. I strongly recommend that patient sees a gastroenterologist in the next 1-2 weeks time frame for eventual colonoscopy and possible EGD.     As for her Escherichia coli UTI causing sepsis she responded well to antibiotics which included Zosyn and cephalosporin, she will be now transitioned to oral cephalosporin based on her culture and sensitivity results. She has no dysuria or flank pain, she is afebrile and leukocytosis is now resolved.     Of note patient was on multiple benzodiazepines and sedative medications which have been titrated down, there is some concern of drug diversion by one of her family members at her home. I will request the nursing staff to be careful upon  discharge while prescribing controlled substances.     Type 2 diabetes mellitus she will resume modified diet along with Glucophage  as before.      Today   Subjective:   Yolanda Medina today has no headache,no chest abdominal pain,no new weakness tingling or numbness, feels much better wants to go home today.    Objective:   Blood pressure 157/65, pulse 89, temperature 99.4 F (37.4 C), temperature source Oral, resp. rate 18, height 5\' 3"  (1.6 m), weight 63.1 kg (139 lb 1.8 oz), SpO2 94.00%.   Intake/Output Summary (Last 24 hours) at 03/13/13 0815 Last data filed at 03/13/13 0800  Gross per 24 hour  Intake    570 ml  Output    350 ml  Net    220 ml    Exam Awake Alert, Oriented *3, No new F.N deficits, Normal affect Sims.AT,PERRAL Supple Neck,No JVD, No cervical lymphadenopathy appriciated.  Symmetrical Chest wall movement, Good air movement bilaterally, CTAB RRR,No Gallops,Rubs or new Murmurs, No Parasternal Heave +ve B.Sounds, Abd Soft, Non tender, No organomegaly appriciated, No rebound -guarding or rigidity. No Cyanosis, Clubbing or edema, No new Rash or bruise  Data Review   CBC w Diff: Lab Results  Component Value Date   WBC 5.7 03/12/2013   HGB 8.9* 03/12/2013   HCT 25.8* 03/12/2013   PLT 198 03/12/2013   LYMPHOPCT 6* 03/06/2013   MONOPCT 9 03/06/2013   EOSPCT 0 03/06/2013   BASOPCT 0 03/06/2013    CMP: Lab Results  Component Value Date   NA 136 03/12/2013   K 3.4* 03/12/2013   CL 102 03/12/2013   CO2 24 03/12/2013   BUN 7 03/12/2013   CREATININE 0.68 03/12/2013   PROT 4.7* 03/07/2013   ALBUMIN 2.4* 03/07/2013   BILITOT 0.2* 03/07/2013   ALKPHOS 53 03/07/2013   AST 47* 03/07/2013   ALT 17 03/07/2013  .   Total Time in preparing paper work, data evaluation and todays exam - 35 minutes  Leroy Sea M.D on 03/13/2013 at 8:15 AM  [facility] Office  [phone]

## 2013-03-13 NOTE — Clinical Social Work Placement (Signed)
Clinical Social Work Department CLINICAL SOCIAL WORK PLACEMENT NOTE 03/13/2013  Patient:  Yolanda Medina, Yolanda Medina  Account Number:  [id number] Admit date:  03/06/2013  Clinical Social Worker:  Lavell Luster  Date/time:  03/12/2013 04:30 PM  Clinical Social Work is seeking post-discharge placement for this patient at the following level of care:   SKILLED NURSING   (*CSW will update this form in Epic as items are completed)   03/12/2013  Patient/family provided with [facility] Department of Clinical Social Work's list of facilities offering this level of care within the geographic area requested by the patient (or if unable, by the patient's family).  03/12/2013  Patient/family informed of their freedom to choose among providers that offer the needed level of care, that participate in Medicare, Medicaid or managed care program needed by the patient, have an available bed and are willing to accept the patient.  03/12/2013  Patient/family informed of [facility]' ownership interest in [facility], as well as of the fact that they are under no obligation to receive care at this facility.  PASARR submitted to EDS on 03/12/2013 PASARR number received from EDS on 03/12/2013  FL2 transmitted to all facilities in geographic area requested by pt/family on  03/12/2013 FL2 transmitted to all facilities within larger geographic area on   Patient informed that his/her managed care company has contracts with or will negotiate with  certain facilities, including the following:     Patient/family informed of bed offers received:  03/13/2013 Patient chooses bed at [facility] Physician recommends and patient chooses bed at    Patient to be transferred to [facility] on  03/13/2013 Patient to be transferred to facility by Ambulance  The following physician request were entered in Epic:   Additional Comments: Per MD patient ready to DC to [facility] 03/13/13. RN, patient, son, and facility notified of DC. RN given number for report. Ambulance requested for patient transport. DC packet left with chart. Patient will complete her own paperwork at facility. CSW signing off.  Roddie Mc, [place], [place], [phone]

## 2013-03-17 ENCOUNTER — Encounter: Payer: Self-pay | Admitting: Gastroenterology

## 2013-04-09 ENCOUNTER — Ambulatory Visit: Payer: Medicare Other | Admitting: Gastroenterology

## 2014-10-14 ENCOUNTER — Emergency Department (HOSPITAL_COMMUNITY): Payer: Medicare Other

## 2014-10-14 ENCOUNTER — Encounter (HOSPITAL_COMMUNITY): Payer: Self-pay | Admitting: Emergency Medicine

## 2014-10-14 ENCOUNTER — Emergency Department (HOSPITAL_COMMUNITY)
Admission: EM | Admit: 2014-10-14 | Discharge: 2014-10-17 | Disposition: A | Discharge status: Other Acute Inpatient Hospital | Payer: Medicare Other | Attending: Emergency Medicine | Admitting: Emergency Medicine

## 2014-10-14 DIAGNOSIS — I10 Essential (primary) hypertension: Secondary | ICD-10-CM | POA: Insufficient documentation

## 2014-10-14 DIAGNOSIS — E119 Type 2 diabetes mellitus without complications: Secondary | ICD-10-CM | POA: Insufficient documentation

## 2014-10-14 DIAGNOSIS — Z8619 Personal history of other infectious and parasitic diseases: Secondary | ICD-10-CM | POA: Diagnosis not present

## 2014-10-14 DIAGNOSIS — E780 Pure hypercholesterolemia: Secondary | ICD-10-CM | POA: Diagnosis not present

## 2014-10-14 DIAGNOSIS — F419 Anxiety disorder, unspecified: Secondary | ICD-10-CM | POA: Insufficient documentation

## 2014-10-14 DIAGNOSIS — F0390 Unspecified dementia without behavioral disturbance: Secondary | ICD-10-CM | POA: Diagnosis not present

## 2014-10-14 DIAGNOSIS — Z87440 Personal history of urinary (tract) infections: Secondary | ICD-10-CM | POA: Diagnosis not present

## 2014-10-14 DIAGNOSIS — Z8701 Personal history of pneumonia (recurrent): Secondary | ICD-10-CM | POA: Insufficient documentation

## 2014-10-14 DIAGNOSIS — Z792 Long term (current) use of antibiotics: Secondary | ICD-10-CM | POA: Diagnosis not present

## 2014-10-14 DIAGNOSIS — Z79899 Other long term (current) drug therapy: Secondary | ICD-10-CM | POA: Diagnosis not present

## 2014-10-14 DIAGNOSIS — R4182 Altered mental status, unspecified: Secondary | ICD-10-CM | POA: Diagnosis present

## 2014-10-14 DIAGNOSIS — Z8742 Personal history of other diseases of the female genital tract: Secondary | ICD-10-CM | POA: Diagnosis not present

## 2014-10-14 DIAGNOSIS — F22 Delusional disorders: Secondary | ICD-10-CM | POA: Diagnosis not present

## 2014-10-14 HISTORY — DX: Sepsis, unspecified organism: A41.9

## 2014-10-14 HISTORY — DX: Depression, unspecified: F32.A

## 2014-10-14 HISTORY — DX: Major depressive disorder, single episode, unspecified: F32.9

## 2014-10-14 HISTORY — DX: Acute kidney failure, unspecified: N17.9

## 2014-10-14 HISTORY — DX: Urinary tract infection, site not specified: N39.0

## 2014-10-14 HISTORY — DX: Pneumonia, unspecified organism: J18.9

## 2014-10-14 LAB — CBC WITH DIFFERENTIAL/PLATELET
Basophils Absolute: 0 10*3/uL (ref 0.0–0.1)
Basophils Relative: 0 % (ref 0–1)
Eosinophils Absolute: 0.7 10*3/uL (ref 0.0–0.7)
Eosinophils Relative: 9 % — ABNORMAL HIGH (ref 0–5)
HEMATOCRIT: 34.3 % — AB (ref 36.0–46.0)
Hemoglobin: 11.5 g/dL — ABNORMAL LOW (ref 12.0–15.0)
LYMPHS ABS: 1.8 10*3/uL (ref 0.7–4.0)
LYMPHS PCT: 25 % (ref 12–46)
MCH: 31 pg (ref 26.0–34.0)
MCHC: 33.5 g/dL (ref 30.0–36.0)
MCV: 92.5 fL (ref 78.0–100.0)
MONO ABS: 0.6 10*3/uL (ref 0.1–1.0)
MONOS PCT: 8 % (ref 3–12)
NEUTROS ABS: 4.1 10*3/uL (ref 1.7–7.7)
Neutrophils Relative %: 58 % (ref 43–77)
Platelets: 291 10*3/uL (ref 150–400)
RBC: 3.71 MIL/uL — AB (ref 3.87–5.11)
RDW: 15.1 % (ref 11.5–15.5)
WBC: 7.1 10*3/uL (ref 4.0–10.5)

## 2014-10-14 LAB — COMPREHENSIVE METABOLIC PANEL
ALBUMIN: 3.6 g/dL (ref 3.5–5.0)
ALT: 18 U/L (ref 14–54)
ANION GAP: 9 (ref 5–15)
AST: 25 U/L (ref 15–41)
Alkaline Phosphatase: 85 U/L (ref 38–126)
BILIRUBIN TOTAL: 0.4 mg/dL (ref 0.3–1.2)
BUN: 8 mg/dL (ref 6–20)
CHLORIDE: 109 mmol/L (ref 101–111)
CO2: 24 mmol/L (ref 22–32)
Calcium: 9.6 mg/dL (ref 8.9–10.3)
Creatinine, Ser: 0.87 mg/dL (ref 0.44–1.00)
GFR calc non Af Amer: >60 mL/min (ref >60)
GLUCOSE: 104 mg/dL — AB (ref 65–99)
Potassium: 4.3 mmol/L (ref 3.5–5.1)
SODIUM: 142 mmol/L (ref 135–145)
Total Protein: 6.6 g/dL (ref 6.5–8.1)

## 2014-10-14 LAB — LACTIC ACID, PLASMA
Lactic Acid, Venous: 0.9 mmol/L (ref 0.5–2.0)
Lactic Acid, Venous: 2.5 mmol/L (ref 0.5–2.0)

## 2014-10-14 LAB — URINALYSIS, ROUTINE W REFLEX MICROSCOPIC
BILIRUBIN URINE: NEGATIVE
Glucose, UA: NEGATIVE mg/dL
KETONES UR: NEGATIVE mg/dL
Nitrite: NEGATIVE
PROTEIN: NEGATIVE mg/dL
SPECIFIC GRAVITY, URINE: 1.01 (ref 1.005–1.030)
UROBILINOGEN UA: 0.2 mg/dL (ref 0.0–1.0)
pH: 8 (ref 5.0–8.0)

## 2014-10-14 LAB — URINE MICROSCOPIC-ADD ON

## 2014-10-14 LAB — TROPONIN I: Troponin I: <0.03 ng/mL (ref <0.031)

## 2014-10-14 MED ORDER — CEFTRIAXONE SODIUM 1 G IJ SOLR
1.0000 g | Freq: Once | INTRAMUSCULAR | Status: AC
  Administered 2014-10-14: 1 g via INTRAMUSCULAR
  Filled 2014-10-14: qty 10

## 2014-10-14 MED ORDER — LIDOCAINE HCL (PF) 1 % IJ SOLN
INTRAMUSCULAR | Status: AC
Start: 2014-10-14 — End: 2014-10-14
  Administered 2014-10-14: 5 mL
  Filled 2014-10-14: qty 5
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
## 2014-10-14 NOTE — BH Assessment (Signed)
Writer informed TTS Brandi of the consult.  

## 2014-10-14 NOTE — Progress Notes (Addendum)
Writer received call from [place] at [facility] and was informed that patient was asking her about getting a gun. Per NP/PA Donell Sievert, patient meets inpatient criteria.  Melbourne Abts, LCSWA Disposition staff 10/14/2014 10:16 PM

## 2014-10-14 NOTE — ED Notes (Signed)
Phlebotomy at bedside.

## 2014-10-14 NOTE — ED Notes (Addendum)
During hourly round RN spoke to patient about staying in hospital "a little longer"; pt became verbally upset; situation deescalated and patient guided back to bed- pt was noted to be following something on the wall with her eyes, RN asked patient what she was looking at/what she saw, pt stated "I thought I saw something" but could not elaborate; pt now watching TV and resting quietly; call bell within reach
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
## 2014-10-14 NOTE — ED Notes (Addendum)
Patient returned from CT
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
## 2014-10-14 NOTE — ED Notes (Signed)
CRITICAL VALUE ALERT  Critical value received:  Lactic acid 2.5  Date of notification:  10/14/14  Time of notification:  1839  MD notified: Dr. Estell Harpin
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
## 2014-10-14 NOTE — Progress Notes (Addendum)
This is a nurse case management.  Melbourne Abts, LCSWA Disposition staff 10/14/2014 7:26 PM

## 2014-10-15 MED ORDER — LISINOPRIL 10 MG PO TABS
10.0000 mg | ORAL_TABLET | Freq: Every day | ORAL | Status: DC
  Administered 2014-10-16 – 2014-10-17 (×2): 10 mg via ORAL
  Filled 2014-10-15 (×3): qty 1

## 2014-10-15 MED ORDER — ALPRAZOLAM 0.5 MG PO TABS
0.5000 mg | ORAL_TABLET | Freq: Two times a day (BID) | ORAL | Status: DC | PRN
Start: 2014-10-15 — End: 2014-10-17
  Administered 2014-10-15 – 2014-10-17 (×5): 0.5 mg via ORAL
  Filled 2014-10-15 (×6): qty 1

## 2014-10-15 MED ORDER — CHLORZOXAZONE 500 MG PO TABS
500.0000 mg | ORAL_TABLET | Freq: Four times a day (QID) | ORAL | Status: DC | PRN
  Filled 2014-10-15: qty 1

## 2014-10-15 MED ORDER — CITALOPRAM HYDROBROMIDE 10 MG PO TABS
80.0000 mg | ORAL_TABLET | Freq: Every day | ORAL | Status: DC
  Administered 2014-10-15 – 2014-10-17 (×3): 80 mg via ORAL
  Filled 2014-10-15 (×3): qty 8

## 2014-10-15 MED ORDER — TRAMADOL HCL 50 MG PO TABS
50.0000 mg | ORAL_TABLET | Freq: Two times a day (BID) | ORAL | Status: DC | PRN
  Administered 2014-10-15 – 2014-10-16 (×3): 50 mg via ORAL
  Filled 2014-10-15 (×3): qty 1

## 2014-10-15 MED ORDER — PRAVASTATIN SODIUM 20 MG PO TABS
20.0000 mg | ORAL_TABLET | Freq: Every day | ORAL | Status: DC
  Administered 2014-10-15 – 2014-10-17 (×3): 20 mg via ORAL
  Filled 2014-10-15 (×4): qty 1

## 2014-10-15 MED ORDER — PANTOPRAZOLE SODIUM 40 MG PO TBEC
40.0000 mg | DELAYED_RELEASE_TABLET | Freq: Every day | ORAL | Status: DC
Start: 2014-10-15 — End: 2014-10-17
  Administered 2014-10-15 – 2014-10-17 (×3): 40 mg via ORAL
  Filled 2014-10-15 (×3): qty 1

## 2014-10-15 MED ORDER — ALBUTEROL SULFATE HFA 108 (90 BASE) MCG/ACT IN AERS: 1.0000 | INHALATION_SPRAY | Freq: Four times a day (QID) | RESPIRATORY_TRACT | Status: DC | PRN

## 2014-10-15 MED ORDER — HYDROXYZINE HCL 25 MG PO TABS
12.5000 mg | ORAL_TABLET | Freq: Once | ORAL | Status: AC
  Administered 2014-10-15: 12.5 mg via ORAL
  Filled 2014-10-15: qty 1

## 2014-10-15 MED ORDER — METFORMIN HCL 850 MG PO TABS
850.0000 mg | ORAL_TABLET | Freq: Two times a day (BID) | ORAL | Status: DC
  Administered 2014-10-15 – 2014-10-17 (×6): 850 mg via ORAL
  Filled 2014-10-15 (×11): qty 1

## 2014-10-15 MED ORDER — GABAPENTIN 300 MG PO CAPS
300.0000 mg | ORAL_CAPSULE | Freq: Three times a day (TID) | ORAL | Status: DC
  Administered 2014-10-15 – 2014-10-17 (×7): 300 mg via ORAL
  Filled 2014-10-15 (×9): qty 1

## 2014-10-15 MED ORDER — LABETALOL HCL 100 MG PO TABS
100.0000 mg | ORAL_TABLET | Freq: Two times a day (BID) | ORAL | Status: DC
  Administered 2014-10-15 – 2014-10-17 (×5): 100 mg via ORAL
  Filled 2014-10-15 (×12): qty 1

## 2014-10-15 NOTE — ED Notes (Signed)
Leonarda Salon, [phone] **is patient friend/caregiver, Pt gives permission for updates to be given to Ms. Jarold Motto.**

## 2014-10-15 NOTE — ED Notes (Signed)
Pt sleeping at this time.

## 2014-10-15 NOTE — ED Notes (Signed)
Pt requesting "my sleeping pill." Pt unable to recall medication she takes for sleep.

## 2014-10-15 NOTE — ED Notes (Signed)
Pt ate 100% of dinner tray; awaiting reassessment for TTS; pt resting at this time. Also awaiting metformin and pravastatin from pharmacy

## 2014-10-15 NOTE — BHH Counselor (Signed)
This Clinical research associate contacted Hulan Fess, NP to complete psych eval for pt.  Pls see previous EPIC notes, pt has already been examined by TTS and case was discussed with Dr. Gilmore Laroche who determined that pt did not meet inpt criteria.  It was also determined that pt would need case mgt--nursing and sw for possible placement to keep her safe.  Per Hulan Fess, NP, TTS would need to complete another TTS consult, however Doran Stabler, NP, will continue with current plan with nurse case mgt/sw.

## 2014-10-15 NOTE — ED Notes (Signed)
Spoke with Child psychotherapist at [facility], TTS will not be completed until later this afternoon.

## 2014-10-15 NOTE — ED Notes (Signed)
Gave pt crackers and gingerale 

## 2014-10-15 NOTE — ED Notes (Signed)
Pt asking when she can go home. Informed pt of need for TTS reassessment.

## 2014-10-15 NOTE — ED Notes (Signed)
Meal tray at bedside.  
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
## 2014-10-15 NOTE — BH Assessment (Signed)
Second TTS Consult requested for 10/14/14.   Earlier TTS Consult completed on 10/14/14 by Merry Proud L.and paranoid symptoms addressed in 1st assessment.  Discussed with per Donell Sievert, PA:  recommended a re-evaluation by psychiatry in the morning (10/15/14) instead for final disposition. Spoke with Dr Ranae Palms at [facility] and he agreed with plan of action. Spoke with nurse Maralyn Sago and advised her of plan.  Also, per Michel Bickers RN note: -Nurse Case Management is involved as of 10/14/14 to seek placement.  -son [name] was contacted and is involved and seeking help/placement for his mother and he has gotten APS of [employer]  Beryle Flock, MS, CRC, [facility] Triage Specialist [facility]
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
## 2014-10-15 NOTE — Progress Notes (Addendum)
LCSW following case along with TTS.  Patient appears to remain paranoid and needing to be re-assessed, with possible geriatric admission.  Awaiting reassessment and then will follow up with disposition, psych vs placement?    LCSW is trying to understand patient payor source and if she has any form of Medicare or just Medicaid.  This will be important with regards to placement.    Patient does not have Medicare, thus patient will only be Medicaid eligible for ALF. Asked CM to place a PT consult in case patient cleared from psych and needing placement, we must determine what level of care patient will be.  APS called regarding status of patient and their involvement: Child psychotherapist for this patient is Murvin Donning [phone]  Patient is noncompliant with medications using baggies to place all medicine, thus unclear if she is taking medication, possibly over medicating self, not checking her blood sugar, unsteady gait, and spoiled food in refrigerator.   Per SW, patient can bathe self, dress self, and independent however her behaviors are very labile as she is paranoid thus story is inconsistent.  Patient had a UTI and a fall. Patient refused placement at [facility] and [facility] attempted to try and set up medication however patient does not comply with medications scattered all over her self (falling out of pants such as skittles).  APS worker went out on Monday with patient telling date, time, place and correctly answered everything she could ask.  At this time, patient does not feel this is Dementia related but more Mental/Health related with Dementia is a new diagnosis from hospital not neurologist and diagnosis just dx on 10/13/14.  Patient has been in and out of several mental health hospitals throughout her life.    Several reports since last May 2015 with hallucinations, and known hallucination per her report.  Patient is noncompliant and refusing.    Deretha Emory, MSW Clinical Social  Work: Emergency Room [phone]

## 2014-10-16 DIAGNOSIS — F22 Delusional disorders: Secondary | ICD-10-CM | POA: Diagnosis not present

## 2014-10-16 NOTE — ED Notes (Signed)
Breakfast meal tray delivered.

## 2014-10-16 NOTE — ED Notes (Signed)
telepsych placed in room at this time.
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
## 2014-10-16 NOTE — ED Notes (Signed)
Requested meds from pharmacy  

## 2014-10-16 NOTE — Evaluation (Signed)
Physical Therapy Evaluation Patient Details Name: Yolanda Medina MRN: [id number] DOB: 1933-09-15 Today's Date: 10/16/2014   History of Present Illness  pt is a 81 y/o female admitted with AMS and weakness.  HHRN found pt in an ?angry and ?paranoid state.  pt appears less so on eval and is tearful that "they" want to take her away from her home.  Clinical Impression  Pt is at or close to baseline functioning and should be able to mobilize safely in her home.  May need services to check on her on a regular basis.  HHPT for short time to work on higher level balance and strengthening would be warranted.  There are no further acute PT needs.  Will sign off at this time.     Follow Up Recommendations Home health PT    Equipment Recommendations  None recommended by PT    Recommendations for Other Services       Precautions / Restrictions Precautions Precautions: Fall (minimal fall risk)      Mobility  Bed Mobility Overal bed mobility: Modified Independent                Transfers Overall transfer level: Modified independent                  Ambulation/Gait Ambulation/Gait assistance: Supervision Ambulation Distance (Feet): 300 Feet Assistive device: None Gait Pattern/deviations: Step-through pattern   Gait velocity interpretation: at or above normal speed for age/gender General Gait Details: mildly unsteady at times with no overt LOB  Stairs            Wheelchair Mobility    Modified Rankin (Stroke Patients Only)       Balance Overall balance assessment: Modified Independent   Sitting balance-Leahy Scale: Good       Standing balance-Leahy Scale: Good                   Standardized Balance Assessment Standardized Balance Assessment : Dynamic Gait Index   Dynamic Gait Index Level Surface: Mild Impairment Change in Gait Speed: Normal Gait with Horizontal Head Turns: Mild Impairment Gait with Vertical Head Turns: Normal Gait and  Pivot Turn: Normal Step Over Obstacle: Normal Step Around Obstacles: Normal       Pertinent Vitals/Pain Pain Assessment: No/denies pain    Home Living Family/patient expects to be discharged to:: Private residence Living Arrangements: Alone Available Help at Discharge: Friend(s);Available PRN/intermittently Type of Home: Mobile home Home Access: Stairs to enter Entrance Stairs-Rails: Right;Left Entrance Stairs-Number of Steps: 2 Home Layout: One level Home Equipment: Walker - 2 wheels;Cane - single point;Bedside commode;Shower seat      Prior Function Level of Independence: Independent               Hand Dominance        Extremity/Trunk Assessment   Upper Extremity Assessment: Overall WFL for tasks assessed           Lower Extremity Assessment: Overall WFL for tasks assessed (some truncal weakness and proximal LER weakness)      Cervical / Trunk Assessment: Normal  Communication   Communication: No difficulties  Cognition Arousal/Alertness: Awake/alert Behavior During Therapy: Restless (tearful at first) Overall Cognitive Status: Within Functional Limits for tasks assessed                      General Comments General comments (skin integrity, edema, etc.): DGI not complete, but  shows some mild instabilities, lower risk of falls suggested,  but the risk does exist.  Likely safe in a home like environment.    Exercises        Assessment/Plan    PT Assessment All further PT needs can be met in the next venue of care  PT Diagnosis Generalized weakness   PT Problem List Decreased strength;Decreased balance;Decreased mobility  PT Treatment Interventions     PT Goals (Current goals can be found in the Care Plan section) Acute Rehab PT Goals PT Goal Formulation: All assessment and education complete, DC therapy    Frequency     Barriers to discharge        Co-evaluation               End of Session     Patient left: in  bed Nurse Communication: Mobility status    Functional Assessment Tool Used: clinical judgement Functional Limitation: Mobility: Walking and moving around Mobility: Walking and Moving Around Current Status (J4099): At least 1 percent but less than 20 percent impaired, limited or restricted Mobility: Walking and Moving Around Goal Status [phone]): At least 1 percent but less than 20 percent impaired, limited or restricted Mobility: Walking and Moving Around Discharge Status [phone]): At least 1 percent but less than 20 percent impaired, limited or restricted (close to Indep. levels in ED, likely safe with mob. at home)    Time: 8063-8685 PT Time Calculation (min) (ACUTE ONLY): 27 min   Charges:   PT Evaluation $Initial PT Evaluation Tier I: 1 Procedure PT Treatments $Gait Training: 8-22 mins   PT G Codes:   PT G-Codes **NOT FOR INPATIENT CLASS** Functional Assessment Tool Used: clinical judgement Functional Limitation: Mobility: Walking and moving around Mobility: Walking and Moving Around Current Status (K8830): At least 1 percent but less than 20 percent impaired, limited or restricted Mobility: Walking and Moving Around Goal Status [phone]): At least 1 percent but less than 20 percent impaired, limited or restricted Mobility: Walking and Moving Around Discharge Status [phone]): At least 1 percent but less than 20 percent impaired, limited or restricted (close to Indep. levels in ED, likely safe with mob. at home)    Sharif Rendell, Tessie Fass 10/16/2014, 10:25 AM  10/16/2014  Donnella Sham, PT [phone]  (pager)

## 2014-10-16 NOTE — Progress Notes (Signed)
Pt referral faxed to the following facilities who report they are accepting referrals or have geriatric bed availability:  [facility]  Will continue seeking placement.  Chad Cordial, LCSWA 10/16/2014 1:48 PM    '

## 2014-10-16 NOTE — ED Notes (Signed)
Patient requested contact information for her friend Leonarda Salon and was provided with that persons telephone number.

## 2014-10-16 NOTE — Progress Notes (Addendum)
Referral was followed up at: [facility] - per Cathi Roan, "I'm reviewing pt's referral, if you have IVC paper fax it to me." Per Phoebe Sharps has 2 gero beds open (malel or female). [facility] - per [facility], "referral not found" refax it. Referral refaxed and went through at 23:30. [facility] - voicemail. [facility] - per intake, fax it for review. [facility] - voicemail.  Declined at: [facility] - per Sue Lush, due to acuity.   At capacity:  [facility] - d/c on 6/25 [facility]  [facility]   CSW will continue to seek placement.  Melbourne Abts, LCSWA Disposition staff 10/16/2014 11:18 PM

## 2014-10-16 NOTE — ED Notes (Signed)
[facility] called to question dementia hx diagnosis. [facility] advised they are still looking for geriatric placement.  This RN requested a reevaluation of patient.

## 2014-10-16 NOTE — ED Notes (Signed)
Patient repositioned in the bed and given apple juice.

## 2014-10-16 NOTE — ED Notes (Signed)
Patient climb out of the bed states, " I am not helpless" Patient advised to ring the call bell when she needs to use the restroom to reduce falls. Patient states she understands.

## 2014-10-16 NOTE — ED Notes (Signed)
Pt requested coffee to drink and cream to go in the coffee. Pt given the same

## 2014-10-16 NOTE — ED Notes (Signed)
Pt constantly ringing out wanting to go home, pt nervous and requesting something for her anxiety.

## 2014-10-17 LAB — URINE CULTURE

## 2014-10-17 LAB — URINALYSIS, ROUTINE W REFLEX MICROSCOPIC
BILIRUBIN URINE: NEGATIVE
Glucose, UA: NEGATIVE mg/dL
HGB URINE DIPSTICK: NEGATIVE
Ketones, ur: NEGATIVE mg/dL
LEUKOCYTES UA: NEGATIVE
NITRITE: NEGATIVE
Protein, ur: NEGATIVE mg/dL
Specific Gravity, Urine: 1.017 (ref 1.005–1.030)
Urobilinogen, UA: 0.2 mg/dL (ref 0.0–1.0)
pH: 5 (ref 5.0–8.0)

## 2014-10-17 LAB — CBG MONITORING, ED: Glucose-Capillary: 89 mg/dL (ref 65–99)

## 2014-10-17 NOTE — ED Notes (Signed)
Sheriff called to confirm transport at 1800

## 2014-10-17 NOTE — Care Management (Signed)
ED CM spoke with Seward Speck LCSW concerning placement. Patient accepted to  [facility]. Vernie Murders. IVC pending. No further ED CM needs identified.

## 2014-10-17 NOTE — ED Provider Notes (Signed)
5:18 PM Accepted to [facility] by Dr. Ria Comment. Will transfer.   Clinical Impression 1. Paranoia      Purvis Sheffield, MD 10/17/14 1719

## 2014-10-17 NOTE — ED Notes (Signed)
IVC paperwork faxed to magistrate.  

## 2014-10-17 NOTE — ED Notes (Signed)
Spoke with RN Brett Fairy at [facility] and she states pt has a room 326 with accepting MD Lars Mage but can not come until after 7:00pm.

## 2014-10-17 NOTE — ED Provider Notes (Signed)
Attempting to place pt in a Geripsych unit.  Pending placement.  Pt was threatening to get a gun and shoot the people who are going to get into her house.  Seen by psych yesterday and feel she meets inpt criteria.  Pt's UA today showed pseudomonas and enterococcus growing however sample appears contaminated.  Will repeat UA with in and out cath to confirm it is clean.  Pt has no c/o of dysuria, fever or white count.  11:45 AM Repeat UA wnl.  Feel prior test was contaminated.  No treatment needed at this time.  Pt IVC'd and [facility] at [facility]  Gwyneth Sprout, MD 10/17/14 1146

## 2014-10-17 NOTE — ED Notes (Signed)
IVC paper's served by [employer].  IVC papers faxed to [facility].

## 2014-10-17 NOTE — ED Notes (Signed)
Left message for [facility] for transport.

## 2014-10-17 NOTE — ED Notes (Signed)
Gave report to Alfredia Client Rn at [facility].

## 2014-10-17 NOTE — Progress Notes (Signed)
Disposition CSW completed referrals to the following inpatient psych facilities:  [facility]  CSW will continue to follow patient with their placement needs.  Seward Speck [facility] Disposition CSW [phone]

## 2014-10-17 NOTE — Progress Notes (Signed)
Ms. Yolanda Medina from [facility] offered to accept patient if she was IVC's.  [phone], is her number. Seward Speck [facility] Disposition CSW [phone]

## 2014-10-17 NOTE — ED Notes (Signed)
Food that had been faxed @ 6:30 finally arrived @ 8:37. Called @8 :20 for the second time to ask where the food was at and the lady on the line said the food was on the elevator up to our location @ 8:17.

## 2014-10-17 NOTE — ED Notes (Signed)
In process of filling out IVC paperwork for admit to [facility]. Leane Call.

## 2014-10-19 ENCOUNTER — Telehealth (HOSPITAL_COMMUNITY): Payer: Self-pay

## 2014-10-19 NOTE — Telephone Encounter (Signed)
Post ED Visit - Positive Culture Follow-up  Culture report reviewed by antimicrobial stewardship pharmacist: []  Wes Dulaney, Pharm.D., BCPS []  Celedonio Miyamoto, Pharm.D., BCPS []  Georgina Pillion, Pharm.D., BCPS []  [place]rett, [address].D., BCPS, AAHIVP []  Estella Husk, Pharm.D., BCPS, AAHIVP []  Elder Cyphers, [address].D., BCPS  Positive Urine culture, >/= 100,000 colonies -> Enterococcus Faecium & 20,000 colonies Pseudomonas Aeruginosa Chart reviewed by Felicita Gage PA "Do not treat"  Arvid Right 10/19/2014, 5:53 AM

## 2015-09-18 IMAGING — CT CT ABD-PELV W/ CM
2 of 5 series · 17 of 46 positions shown, 19 images · IV contrast (CONTRAST)
Comparison: None.

CLINICAL DATA: Evaluate for portal venous gas, which was reportedly
seen on outside CT imaging

EXAM:
CT ABDOMEN AND PELVIS WITH CONTRAST
TECHNIQUE: Multidetector CT imaging of the abdomen and pelvis was performed
using the standard protocol following bolus administration of
intravenous contrast.
CONTRAST:  100mL OMNIPAQUE IOHEXOL 300 MG/ML  SOLN

[Series 2: routine · axial · 0.68mm/px · z∈[+122,+527]mm · 14 of 93 slices shown, 16 images]
[im 6/93  soft-tissue]
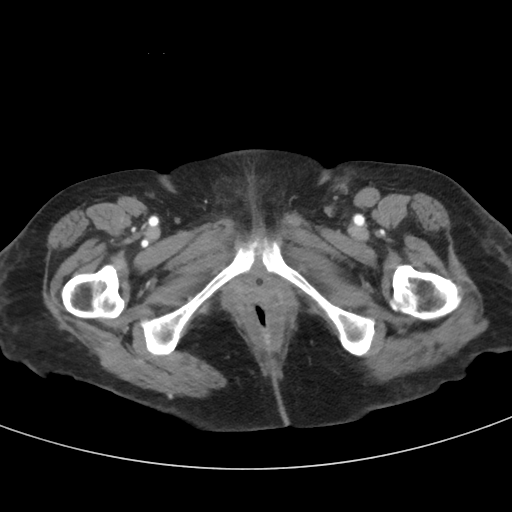
[im 6/93  bone]
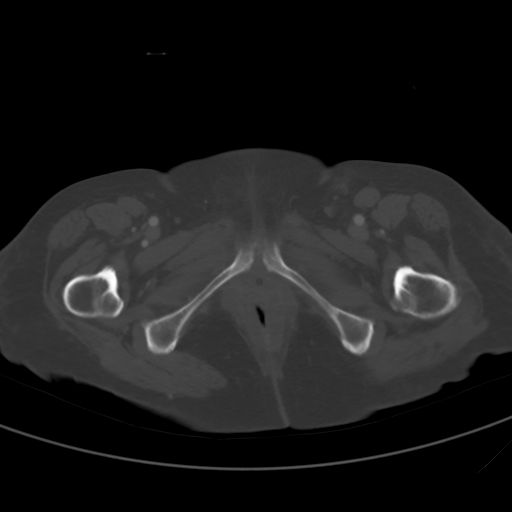
[im 11/93  soft-tissue]
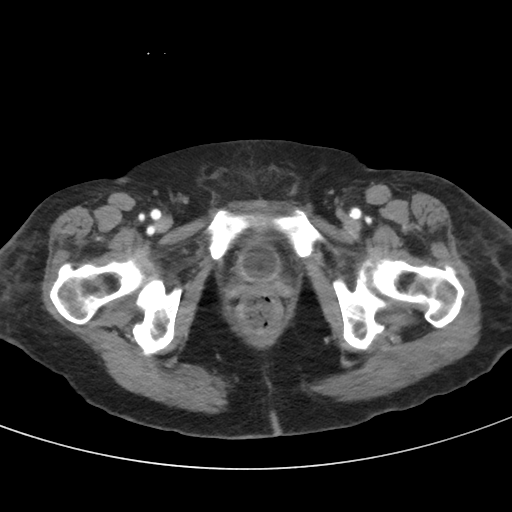
[im 21/93  soft-tissue]
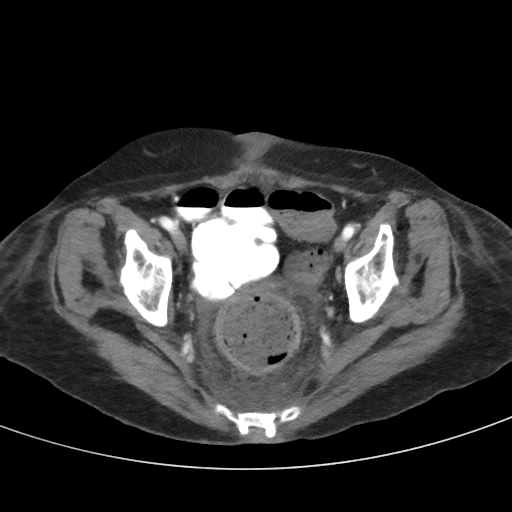
[im 26/93  soft-tissue]
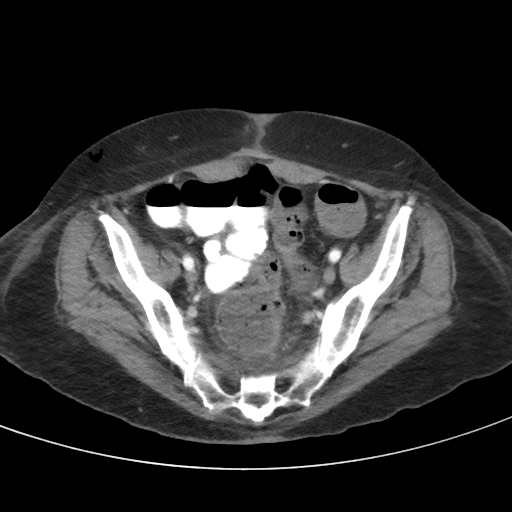
[im 31/93  soft-tissue]
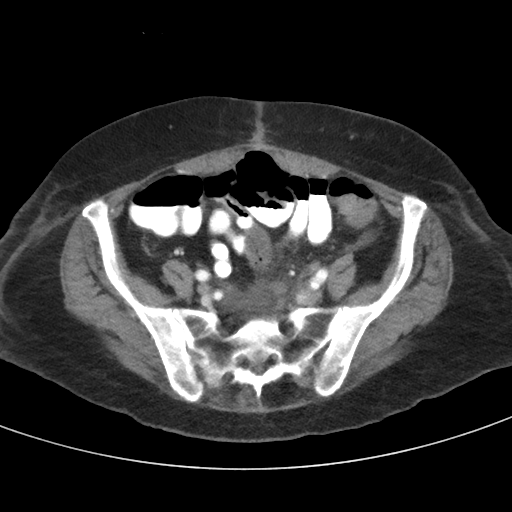
[im 36/93  soft-tissue]
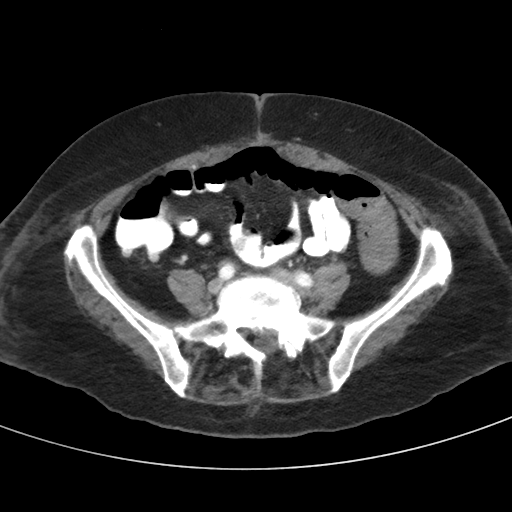
[im 41/93  soft-tissue]
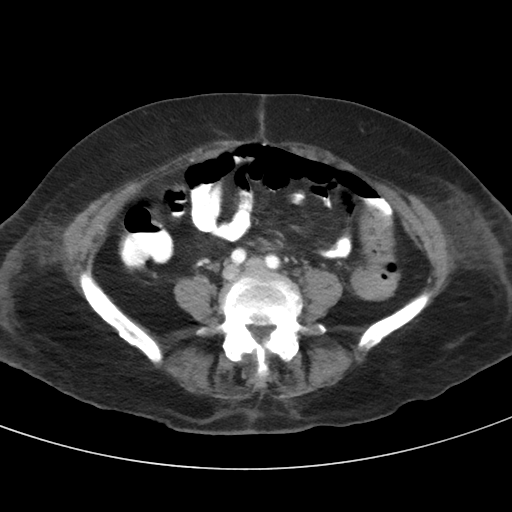
[im 52/93  soft-tissue]
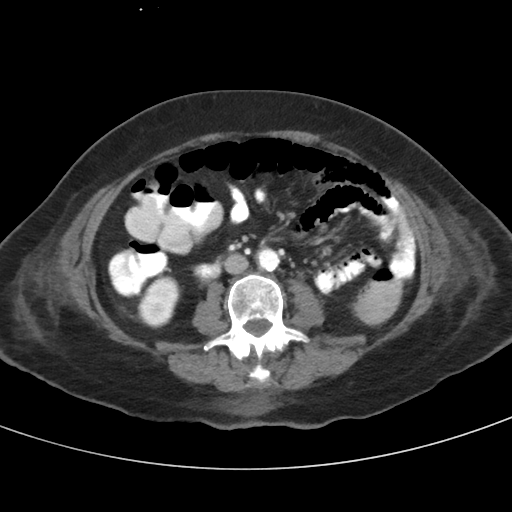
[im 57/93  soft-tissue]
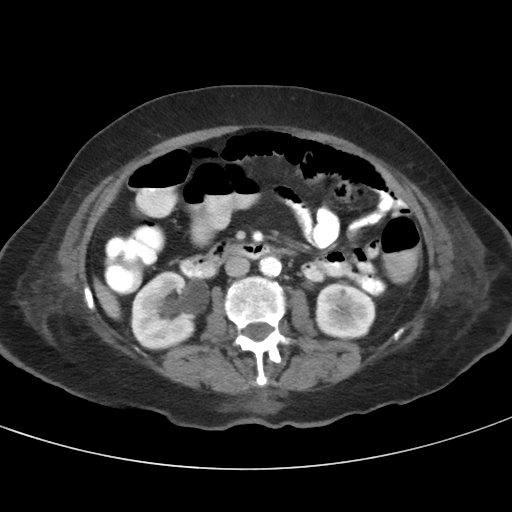
[im 57/93  bone]
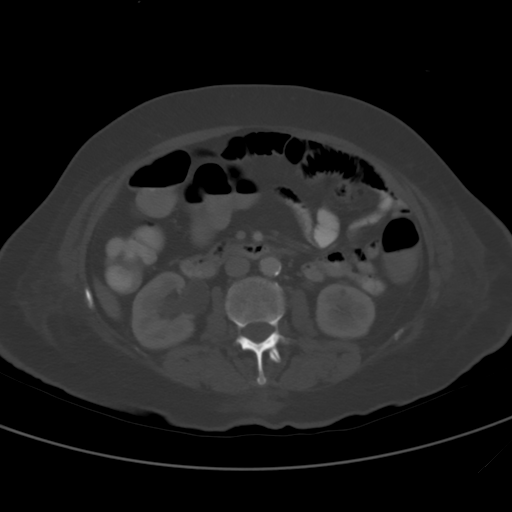
[im 62/93  soft-tissue]
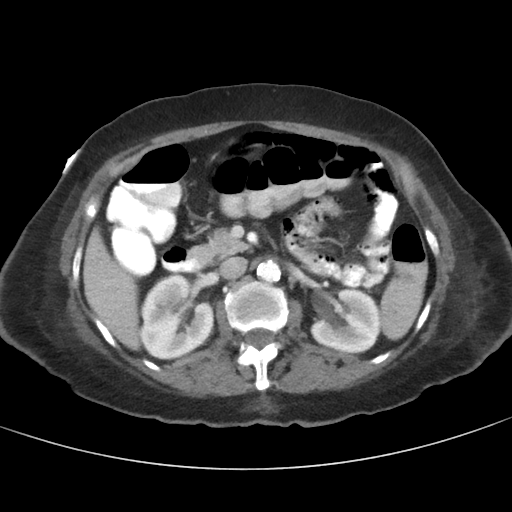
[im 67/93  soft-tissue]
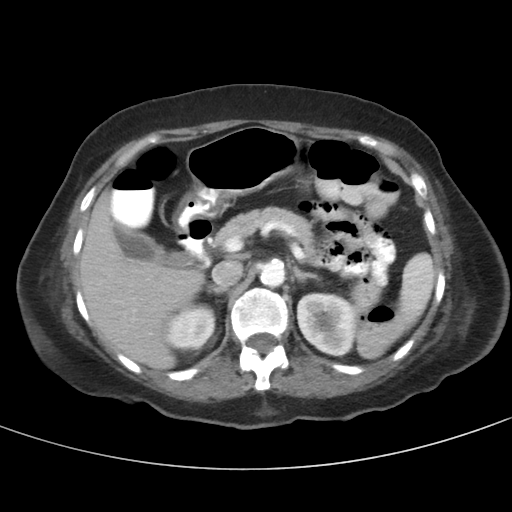
[im 72/93  soft-tissue]
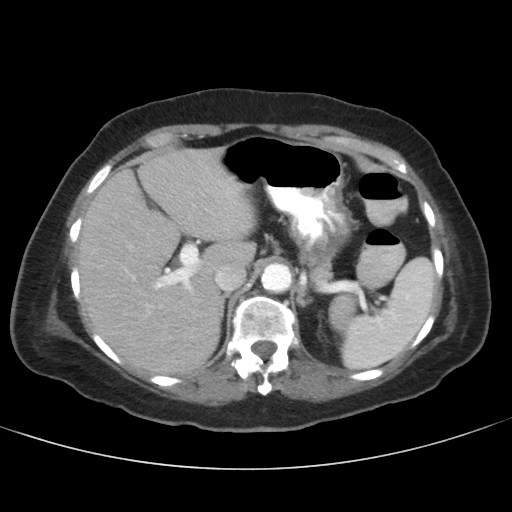
[im 82/93  soft-tissue]
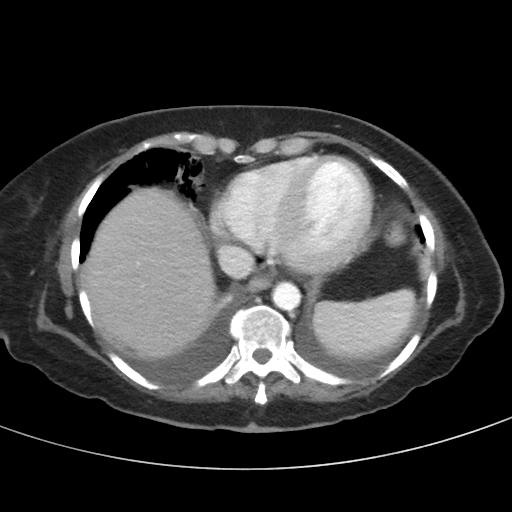
[im 87/93  soft-tissue]
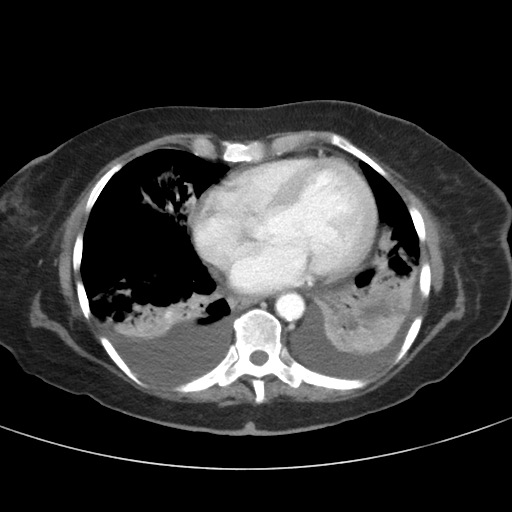

[mpr, coronals, coronal · coronal · 0.90mm/px · 3 of 88 slices shown]
[im 30/88  soft-tissue]
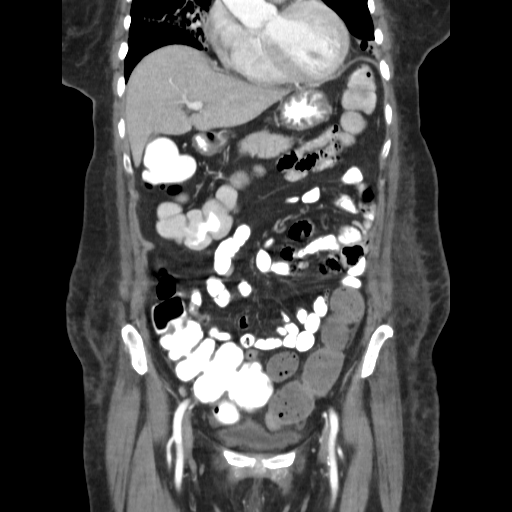
[im 39/88  soft-tissue]
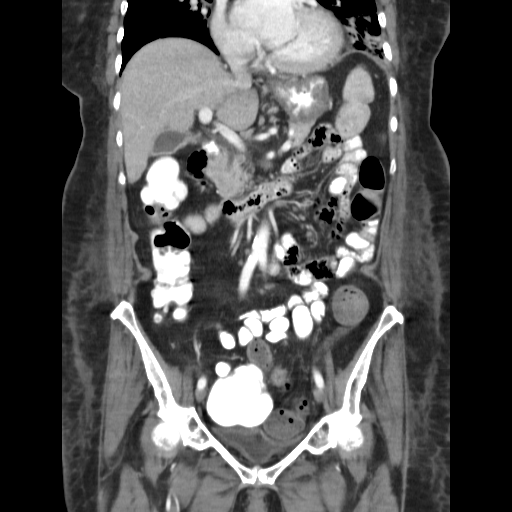
[im 49/88  soft-tissue]
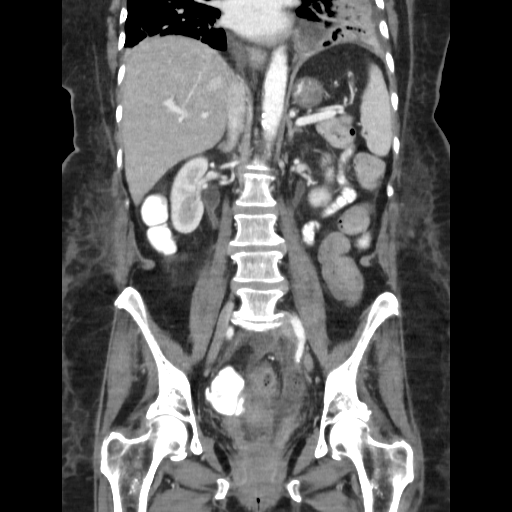

[17 of 46 positions shown; findings below may reference images not displayed]

FINDINGS: BODY WALL: Medication injection side in the lower right abdomen.
Mild body wall edema.

LOWER CHEST: Small to moderate bilateral pleural effusions which are
layering. There is patchy airspace disease in the lower lungs.

ABDOMEN/PELVIS:

Liver: No focal abnormality.

Biliary: No evidence of biliary obstruction or stone.

Pancreas: Unremarkable.

Spleen: Unremarkable.

Adrenals: Unremarkable.

Kidneys and ureters: Symmetric mid fullness of the urinary
collecting systems. No stone.

Bladder: Drained by Foley catheter.

Reproductive: Hysterectomy.

Bowel: Circumferential thickening of the rectal wall, with fairly
extensive mesorectal edema. There is moderate distention of the
rectum by stool, measuring 6 cm in diameter. No bowel obstruction.
Appendectomy.

Retroperitoneum: No mass or adenopathy.

Peritoneum: No free fluid or gas.

Vascular: No portal venous gas seen currently.

OSSEOUS: Left-sided laminotomy at L5-S1.  No acute osseous findings.
IMPRESSION: 1. No portal venous gas seen currently.
2. Proctitis and mesorectal edema. This could be infectious,
inflammatory, or pressure related (stercoral colitis).
3. Bilateral pneumonia with small to moderate pleural effusions.

## 2017-04-22 IMAGING — CR DG CHEST 2V
2 series · 2 of 2 positions shown · non-contrast
Comparison: Single view of the chest 10/08/2014 and 10/13/2014.

CLINICAL DATA: Diagnosis of pneumonia 1 month ago. Altered mental
status today. Initial encounter.

EXAM:
CHEST  2 VIEW

[chest lat]
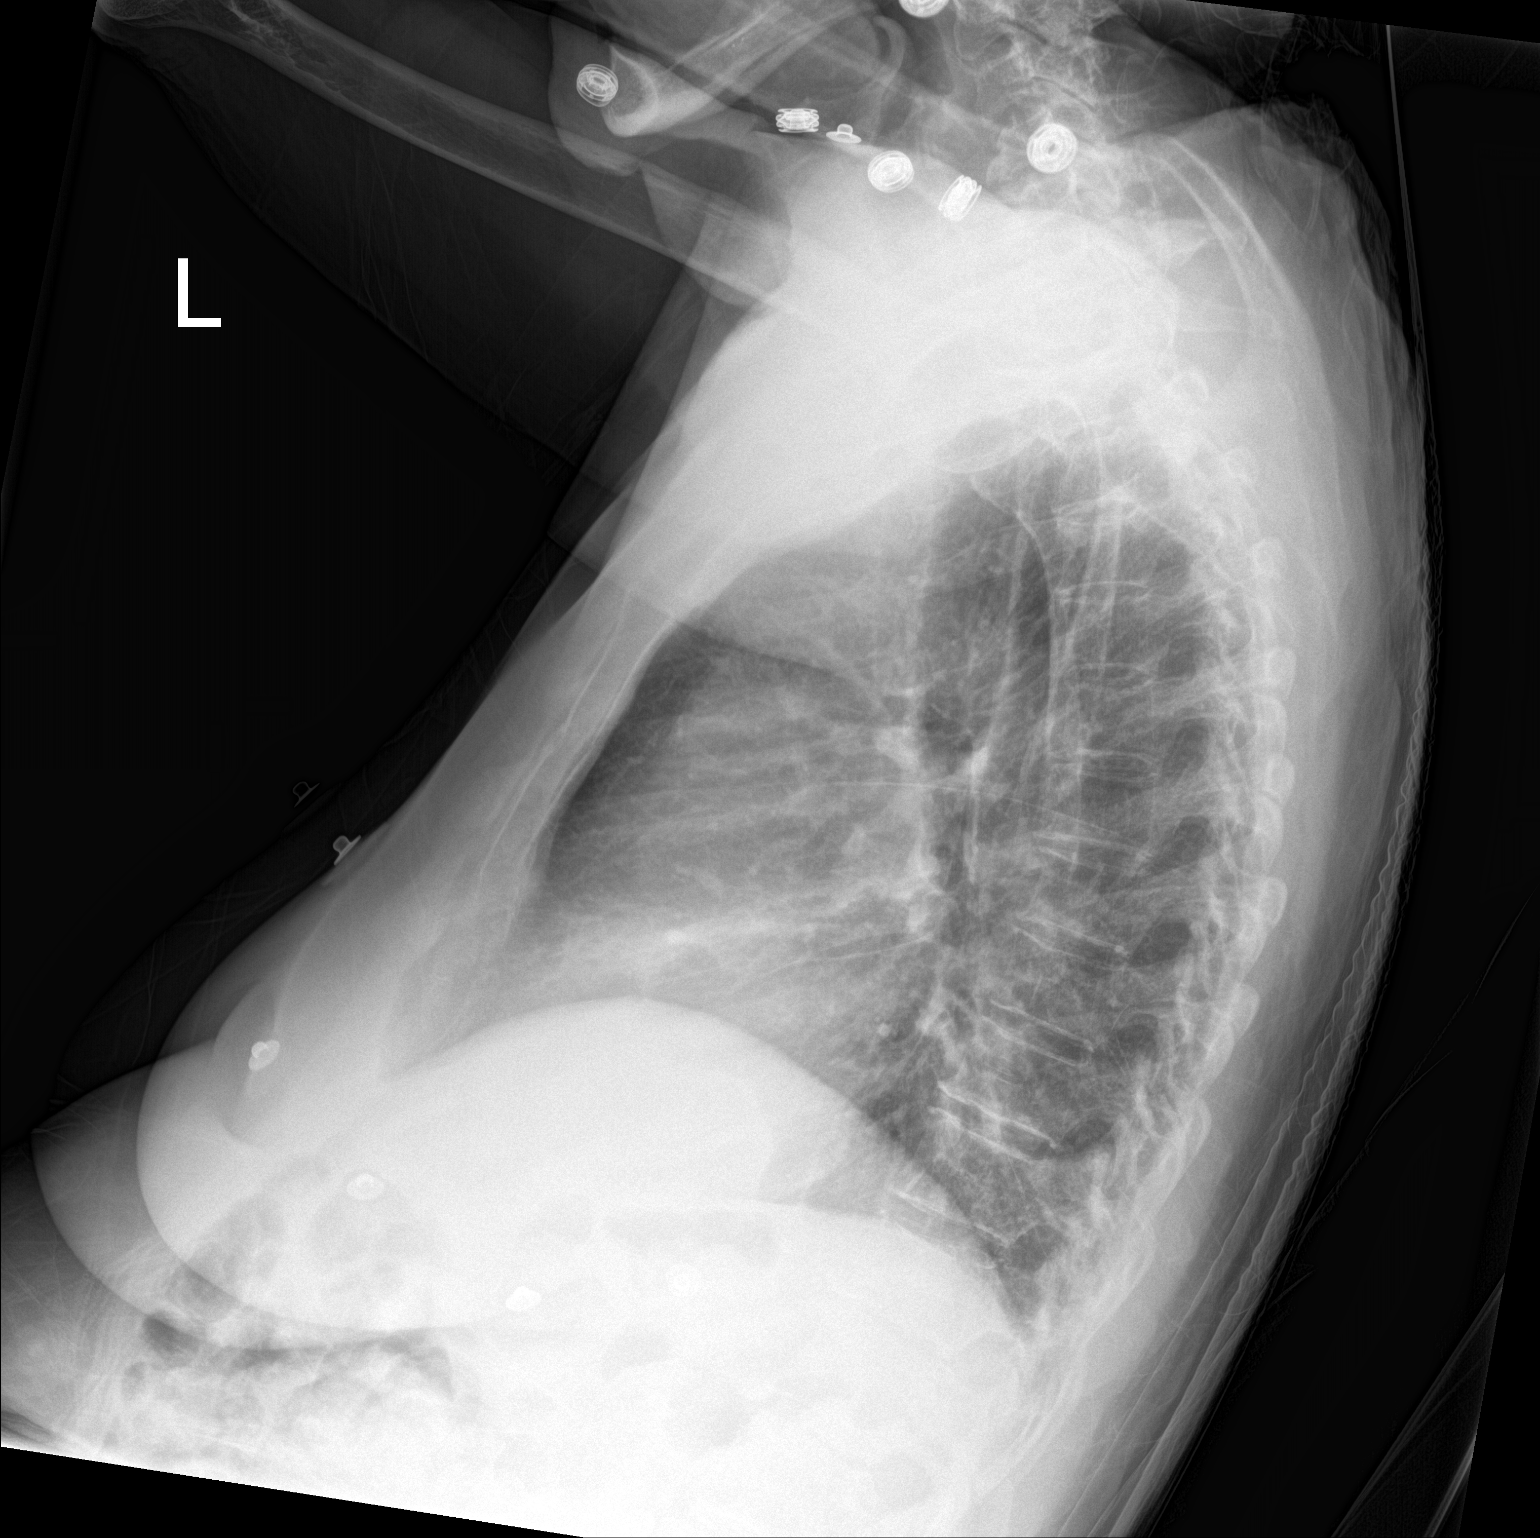

[chest ap]
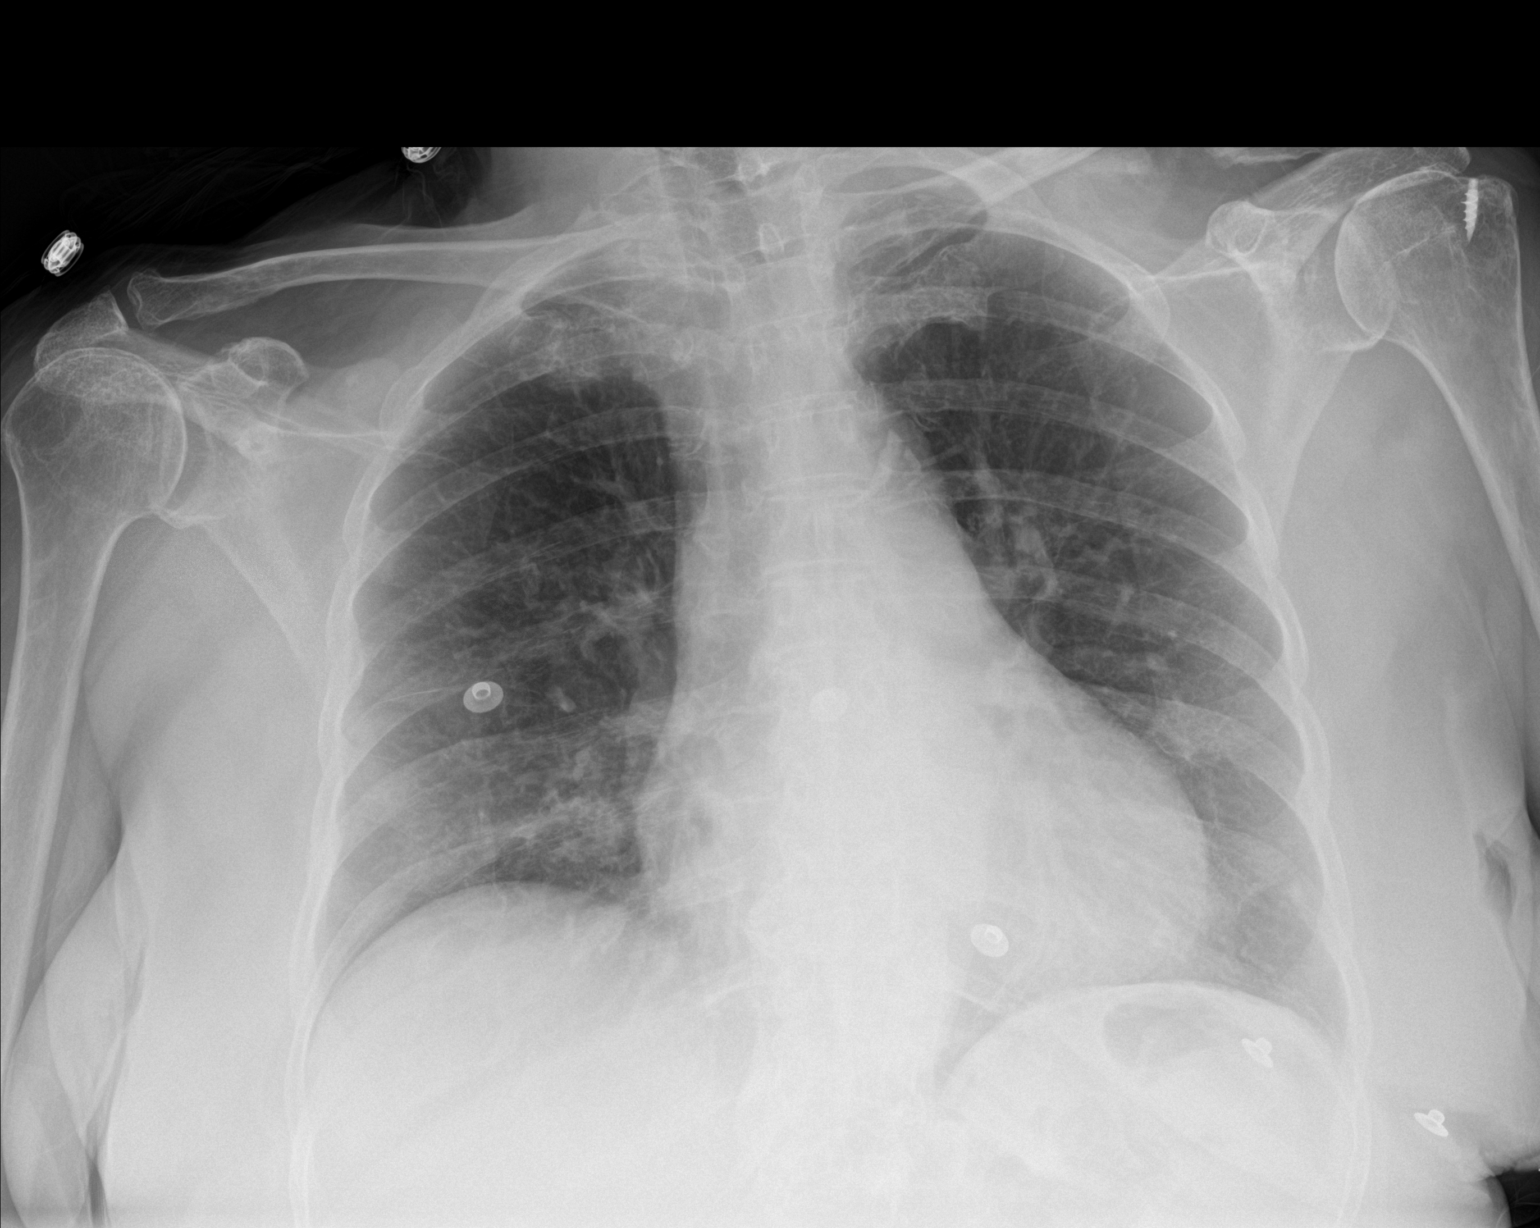

[2 of 2 positions shown; findings below may reference images not displayed]

FINDINGS: There is mild cardiomegaly without edema. The lungs are clear. No
pneumothorax or pleural effusion.
IMPRESSION: Cardiomegaly without acute disease.

## 2018-05-20 DIAGNOSIS — I10 Essential (primary) hypertension: Secondary | ICD-10-CM | POA: Diagnosis not present

## 2018-05-20 DIAGNOSIS — E119 Type 2 diabetes mellitus without complications: Secondary | ICD-10-CM | POA: Diagnosis not present

## 2018-05-20 DIAGNOSIS — E785 Hyperlipidemia, unspecified: Secondary | ICD-10-CM | POA: Diagnosis not present

## 2018-05-20 DIAGNOSIS — N390 Urinary tract infection, site not specified: Secondary | ICD-10-CM | POA: Diagnosis not present

## 2018-06-10 DIAGNOSIS — E1129 Type 2 diabetes mellitus with other diabetic kidney complication: Secondary | ICD-10-CM | POA: Diagnosis not present

## 2018-06-10 DIAGNOSIS — E1140 Type 2 diabetes mellitus with diabetic neuropathy, unspecified: Secondary | ICD-10-CM | POA: Diagnosis not present

## 2018-06-10 DIAGNOSIS — N183 Chronic kidney disease, stage 3 (moderate): Secondary | ICD-10-CM | POA: Diagnosis not present

## 2018-06-10 DIAGNOSIS — E785 Hyperlipidemia, unspecified: Secondary | ICD-10-CM | POA: Diagnosis not present

## 2018-07-01 DIAGNOSIS — Z0000 Encounter for general adult medical examination without abnormal findings: Secondary | ICD-10-CM | POA: Diagnosis not present

## 2018-07-01 DIAGNOSIS — N390 Urinary tract infection, site not specified: Secondary | ICD-10-CM | POA: Diagnosis not present

## 2018-07-01 DIAGNOSIS — I10 Essential (primary) hypertension: Secondary | ICD-10-CM | POA: Diagnosis not present

## 2018-07-01 DIAGNOSIS — Z139 Encounter for screening, unspecified: Secondary | ICD-10-CM | POA: Diagnosis not present

## 2018-07-01 DIAGNOSIS — R05 Cough: Secondary | ICD-10-CM | POA: Diagnosis not present

## 2018-07-08 DIAGNOSIS — N390 Urinary tract infection, site not specified: Secondary | ICD-10-CM | POA: Diagnosis not present

## 2018-07-23 DIAGNOSIS — E1129 Type 2 diabetes mellitus with other diabetic kidney complication: Secondary | ICD-10-CM | POA: Diagnosis not present

## 2018-07-23 DIAGNOSIS — N183 Chronic kidney disease, stage 3 (moderate): Secondary | ICD-10-CM | POA: Diagnosis not present

## 2018-07-23 DIAGNOSIS — E785 Hyperlipidemia, unspecified: Secondary | ICD-10-CM | POA: Diagnosis not present

## 2018-07-23 DIAGNOSIS — E1140 Type 2 diabetes mellitus with diabetic neuropathy, unspecified: Secondary | ICD-10-CM | POA: Diagnosis not present

## 2018-08-22 DIAGNOSIS — E785 Hyperlipidemia, unspecified: Secondary | ICD-10-CM | POA: Diagnosis not present

## 2018-08-22 DIAGNOSIS — E1129 Type 2 diabetes mellitus with other diabetic kidney complication: Secondary | ICD-10-CM | POA: Diagnosis not present

## 2018-08-22 DIAGNOSIS — N183 Chronic kidney disease, stage 3 (moderate): Secondary | ICD-10-CM | POA: Diagnosis not present

## 2018-08-22 DIAGNOSIS — E1140 Type 2 diabetes mellitus with diabetic neuropathy, unspecified: Secondary | ICD-10-CM | POA: Diagnosis not present

## 2018-08-30 DIAGNOSIS — E785 Hyperlipidemia, unspecified: Secondary | ICD-10-CM | POA: Diagnosis not present

## 2018-08-30 DIAGNOSIS — I10 Essential (primary) hypertension: Secondary | ICD-10-CM | POA: Diagnosis not present

## 2018-08-30 DIAGNOSIS — E1129 Type 2 diabetes mellitus with other diabetic kidney complication: Secondary | ICD-10-CM | POA: Diagnosis not present

## 2018-09-12 DIAGNOSIS — N183 Chronic kidney disease, stage 3 (moderate): Secondary | ICD-10-CM | POA: Diagnosis not present

## 2018-09-12 DIAGNOSIS — I129 Hypertensive chronic kidney disease with stage 1 through stage 4 chronic kidney disease, or unspecified chronic kidney disease: Secondary | ICD-10-CM | POA: Diagnosis not present

## 2018-09-12 DIAGNOSIS — E1129 Type 2 diabetes mellitus with other diabetic kidney complication: Secondary | ICD-10-CM | POA: Diagnosis not present

## 2018-09-12 DIAGNOSIS — E785 Hyperlipidemia, unspecified: Secondary | ICD-10-CM | POA: Diagnosis not present

## 2018-09-20 DIAGNOSIS — E1129 Type 2 diabetes mellitus with other diabetic kidney complication: Secondary | ICD-10-CM | POA: Diagnosis not present

## 2018-09-20 DIAGNOSIS — N183 Chronic kidney disease, stage 3 (moderate): Secondary | ICD-10-CM | POA: Diagnosis not present

## 2018-09-20 DIAGNOSIS — E785 Hyperlipidemia, unspecified: Secondary | ICD-10-CM | POA: Diagnosis not present

## 2018-09-20 DIAGNOSIS — I129 Hypertensive chronic kidney disease with stage 1 through stage 4 chronic kidney disease, or unspecified chronic kidney disease: Secondary | ICD-10-CM | POA: Diagnosis not present

## 2018-10-04 DIAGNOSIS — R300 Dysuria: Secondary | ICD-10-CM | POA: Diagnosis not present

## 2018-10-22 DIAGNOSIS — E1129 Type 2 diabetes mellitus with other diabetic kidney complication: Secondary | ICD-10-CM | POA: Diagnosis not present

## 2018-10-22 DIAGNOSIS — I129 Hypertensive chronic kidney disease with stage 1 through stage 4 chronic kidney disease, or unspecified chronic kidney disease: Secondary | ICD-10-CM | POA: Diagnosis not present

## 2018-10-22 DIAGNOSIS — E785 Hyperlipidemia, unspecified: Secondary | ICD-10-CM | POA: Diagnosis not present

## 2018-10-22 DIAGNOSIS — N183 Chronic kidney disease, stage 3 (moderate): Secondary | ICD-10-CM | POA: Diagnosis not present

## 2018-12-12 DIAGNOSIS — E1140 Type 2 diabetes mellitus with diabetic neuropathy, unspecified: Secondary | ICD-10-CM | POA: Diagnosis not present

## 2018-12-12 DIAGNOSIS — E785 Hyperlipidemia, unspecified: Secondary | ICD-10-CM | POA: Diagnosis not present

## 2018-12-12 DIAGNOSIS — I10 Essential (primary) hypertension: Secondary | ICD-10-CM | POA: Diagnosis not present

## 2018-12-19 DIAGNOSIS — N183 Chronic kidney disease, stage 3 (moderate): Secondary | ICD-10-CM | POA: Diagnosis not present

## 2018-12-19 DIAGNOSIS — I129 Hypertensive chronic kidney disease with stage 1 through stage 4 chronic kidney disease, or unspecified chronic kidney disease: Secondary | ICD-10-CM | POA: Diagnosis not present

## 2018-12-19 DIAGNOSIS — E1140 Type 2 diabetes mellitus with diabetic neuropathy, unspecified: Secondary | ICD-10-CM | POA: Diagnosis not present

## 2018-12-19 DIAGNOSIS — E785 Hyperlipidemia, unspecified: Secondary | ICD-10-CM | POA: Diagnosis not present

## 2018-12-19 DIAGNOSIS — N390 Urinary tract infection, site not specified: Secondary | ICD-10-CM | POA: Diagnosis not present

## 2018-12-30 DIAGNOSIS — N281 Cyst of kidney, acquired: Secondary | ICD-10-CM | POA: Diagnosis not present

## 2018-12-30 DIAGNOSIS — I959 Hypotension, unspecified: Secondary | ICD-10-CM | POA: Diagnosis not present

## 2018-12-30 DIAGNOSIS — K5730 Diverticulosis of large intestine without perforation or abscess without bleeding: Secondary | ICD-10-CM | POA: Diagnosis not present

## 2018-12-30 DIAGNOSIS — R5381 Other malaise: Secondary | ICD-10-CM | POA: Diagnosis not present

## 2018-12-30 DIAGNOSIS — R0902 Hypoxemia: Secondary | ICD-10-CM | POA: Diagnosis not present

## 2018-12-30 DIAGNOSIS — R3129 Other microscopic hematuria: Secondary | ICD-10-CM | POA: Diagnosis not present

## 2018-12-30 DIAGNOSIS — K5732 Diverticulitis of large intestine without perforation or abscess without bleeding: Secondary | ICD-10-CM | POA: Diagnosis not present

## 2018-12-30 DIAGNOSIS — N390 Urinary tract infection, site not specified: Secondary | ICD-10-CM | POA: Diagnosis not present

## 2018-12-30 DIAGNOSIS — R531 Weakness: Secondary | ICD-10-CM | POA: Diagnosis not present

## 2019-01-02 DIAGNOSIS — K5792 Diverticulitis of intestine, part unspecified, without perforation or abscess without bleeding: Secondary | ICD-10-CM | POA: Diagnosis not present

## 2019-01-02 DIAGNOSIS — Z7689 Persons encountering health services in other specified circumstances: Secondary | ICD-10-CM | POA: Diagnosis not present

## 2019-01-06 DIAGNOSIS — K5732 Diverticulitis of large intestine without perforation or abscess without bleeding: Secondary | ICD-10-CM | POA: Diagnosis not present

## 2019-01-06 DIAGNOSIS — K5641 Fecal impaction: Secondary | ICD-10-CM | POA: Diagnosis not present

## 2019-01-20 DIAGNOSIS — E1129 Type 2 diabetes mellitus with other diabetic kidney complication: Secondary | ICD-10-CM | POA: Diagnosis not present

## 2019-01-20 DIAGNOSIS — N183 Chronic kidney disease, stage 3 (moderate): Secondary | ICD-10-CM | POA: Diagnosis not present

## 2019-01-20 DIAGNOSIS — Z23 Encounter for immunization: Secondary | ICD-10-CM | POA: Diagnosis not present

## 2019-01-20 DIAGNOSIS — K5792 Diverticulitis of intestine, part unspecified, without perforation or abscess without bleeding: Secondary | ICD-10-CM | POA: Diagnosis not present

## 2019-01-22 DIAGNOSIS — E1129 Type 2 diabetes mellitus with other diabetic kidney complication: Secondary | ICD-10-CM | POA: Diagnosis not present

## 2019-01-22 DIAGNOSIS — N183 Chronic kidney disease, stage 3 (moderate): Secondary | ICD-10-CM | POA: Diagnosis not present

## 2019-01-30 DIAGNOSIS — N390 Urinary tract infection, site not specified: Secondary | ICD-10-CM | POA: Diagnosis not present

## 2019-02-21 DIAGNOSIS — E1129 Type 2 diabetes mellitus with other diabetic kidney complication: Secondary | ICD-10-CM | POA: Diagnosis not present

## 2019-02-21 DIAGNOSIS — K5792 Diverticulitis of intestine, part unspecified, without perforation or abscess without bleeding: Secondary | ICD-10-CM | POA: Diagnosis not present

## 2019-03-31 ENCOUNTER — Other Ambulatory Visit: Payer: Self-pay

## 2019-03-31 NOTE — Patient Outreach (Signed)
[facility]) Care Management  03/31/2019  Yolanda Medina December 06, 1933 [id number]   Medication Adherence call to Yolanda Medina patients telephone number is disconnected. Yolanda Medina is showing past due on Atorvastatin 10 mg under [place].   [place] Management Direct Dial [phone]  Fax [phone] [email or web address]

## 2019-04-15 DIAGNOSIS — E1140 Type 2 diabetes mellitus with diabetic neuropathy, unspecified: Secondary | ICD-10-CM | POA: Diagnosis not present

## 2019-04-15 DIAGNOSIS — I10 Essential (primary) hypertension: Secondary | ICD-10-CM | POA: Diagnosis not present

## 2019-04-15 DIAGNOSIS — E785 Hyperlipidemia, unspecified: Secondary | ICD-10-CM | POA: Diagnosis not present

## 2019-04-23 DIAGNOSIS — K5792 Diverticulitis of intestine, part unspecified, without perforation or abscess without bleeding: Secondary | ICD-10-CM | POA: Diagnosis not present

## 2019-04-23 DIAGNOSIS — E1129 Type 2 diabetes mellitus with other diabetic kidney complication: Secondary | ICD-10-CM | POA: Diagnosis not present

## 2019-05-05 DIAGNOSIS — Z136 Encounter for screening for cardiovascular disorders: Secondary | ICD-10-CM | POA: Diagnosis not present

## 2019-05-05 DIAGNOSIS — I129 Hypertensive chronic kidney disease with stage 1 through stage 4 chronic kidney disease, or unspecified chronic kidney disease: Secondary | ICD-10-CM | POA: Diagnosis not present

## 2019-05-05 DIAGNOSIS — E785 Hyperlipidemia, unspecified: Secondary | ICD-10-CM | POA: Diagnosis not present

## 2019-05-05 DIAGNOSIS — N1831 Chronic kidney disease, stage 3a: Secondary | ICD-10-CM | POA: Diagnosis not present

## 2019-05-05 DIAGNOSIS — Z139 Encounter for screening, unspecified: Secondary | ICD-10-CM | POA: Diagnosis not present

## 2019-05-05 DIAGNOSIS — E1140 Type 2 diabetes mellitus with diabetic neuropathy, unspecified: Secondary | ICD-10-CM | POA: Diagnosis not present

## 2019-05-05 DIAGNOSIS — Z0000 Encounter for general adult medical examination without abnormal findings: Secondary | ICD-10-CM | POA: Diagnosis not present

## 2019-05-23 DIAGNOSIS — E785 Hyperlipidemia, unspecified: Secondary | ICD-10-CM | POA: Diagnosis not present

## 2019-05-23 DIAGNOSIS — E1140 Type 2 diabetes mellitus with diabetic neuropathy, unspecified: Secondary | ICD-10-CM | POA: Diagnosis not present

## 2019-05-23 DIAGNOSIS — I129 Hypertensive chronic kidney disease with stage 1 through stage 4 chronic kidney disease, or unspecified chronic kidney disease: Secondary | ICD-10-CM | POA: Diagnosis not present

## 2019-05-23 DIAGNOSIS — N1831 Chronic kidney disease, stage 3a: Secondary | ICD-10-CM | POA: Diagnosis not present

## 2019-06-22 DIAGNOSIS — E1140 Type 2 diabetes mellitus with diabetic neuropathy, unspecified: Secondary | ICD-10-CM | POA: Diagnosis not present

## 2019-06-22 DIAGNOSIS — N1831 Chronic kidney disease, stage 3a: Secondary | ICD-10-CM | POA: Diagnosis not present

## 2019-06-22 DIAGNOSIS — I129 Hypertensive chronic kidney disease with stage 1 through stage 4 chronic kidney disease, or unspecified chronic kidney disease: Secondary | ICD-10-CM | POA: Diagnosis not present

## 2019-06-22 DIAGNOSIS — E785 Hyperlipidemia, unspecified: Secondary | ICD-10-CM | POA: Diagnosis not present

## 2019-06-24 ENCOUNTER — Other Ambulatory Visit: Payer: Self-pay

## 2019-06-24 NOTE — Patient Outreach (Signed)
[facility]) Care Management  06/24/2019  Yolanda Medina 08/29/1933 [id number]   Medication Adherence call to Mrs. khailee, Yolanda Medina has a disconnected Number. Yolanda Medina is showing past due on Atorvastatin 10 mg under [employer].   Lillia Abed CPhT Pharmacy Technician [facility] Care Management Direct Dial [phone]  Fax [phone] [email or web address]

## 2019-07-23 DIAGNOSIS — E1140 Type 2 diabetes mellitus with diabetic neuropathy, unspecified: Secondary | ICD-10-CM | POA: Diagnosis not present

## 2019-07-23 DIAGNOSIS — N1831 Chronic kidney disease, stage 3a: Secondary | ICD-10-CM | POA: Diagnosis not present

## 2019-07-23 DIAGNOSIS — I129 Hypertensive chronic kidney disease with stage 1 through stage 4 chronic kidney disease, or unspecified chronic kidney disease: Secondary | ICD-10-CM | POA: Diagnosis not present

## 2019-07-23 DIAGNOSIS — E785 Hyperlipidemia, unspecified: Secondary | ICD-10-CM | POA: Diagnosis not present

## 2019-08-22 DIAGNOSIS — E1140 Type 2 diabetes mellitus with diabetic neuropathy, unspecified: Secondary | ICD-10-CM | POA: Diagnosis not present

## 2019-08-22 DIAGNOSIS — N1831 Chronic kidney disease, stage 3a: Secondary | ICD-10-CM | POA: Diagnosis not present

## 2019-08-22 DIAGNOSIS — E785 Hyperlipidemia, unspecified: Secondary | ICD-10-CM | POA: Diagnosis not present

## 2019-08-22 DIAGNOSIS — I129 Hypertensive chronic kidney disease with stage 1 through stage 4 chronic kidney disease, or unspecified chronic kidney disease: Secondary | ICD-10-CM | POA: Diagnosis not present

## 2019-09-08 DIAGNOSIS — I129 Hypertensive chronic kidney disease with stage 1 through stage 4 chronic kidney disease, or unspecified chronic kidney disease: Secondary | ICD-10-CM | POA: Diagnosis not present

## 2019-09-08 DIAGNOSIS — J309 Allergic rhinitis, unspecified: Secondary | ICD-10-CM | POA: Diagnosis not present

## 2019-09-08 DIAGNOSIS — E785 Hyperlipidemia, unspecified: Secondary | ICD-10-CM | POA: Diagnosis not present

## 2019-09-08 DIAGNOSIS — J449 Chronic obstructive pulmonary disease, unspecified: Secondary | ICD-10-CM | POA: Diagnosis not present

## 2019-09-08 DIAGNOSIS — N1831 Chronic kidney disease, stage 3a: Secondary | ICD-10-CM | POA: Diagnosis not present

## 2019-09-08 DIAGNOSIS — E1140 Type 2 diabetes mellitus with diabetic neuropathy, unspecified: Secondary | ICD-10-CM | POA: Diagnosis not present

## 2019-09-15 DIAGNOSIS — J449 Chronic obstructive pulmonary disease, unspecified: Secondary | ICD-10-CM | POA: Diagnosis not present

## 2019-09-22 DIAGNOSIS — N1831 Chronic kidney disease, stage 3a: Secondary | ICD-10-CM | POA: Diagnosis not present

## 2019-09-22 DIAGNOSIS — E785 Hyperlipidemia, unspecified: Secondary | ICD-10-CM | POA: Diagnosis not present

## 2019-09-22 DIAGNOSIS — I129 Hypertensive chronic kidney disease with stage 1 through stage 4 chronic kidney disease, or unspecified chronic kidney disease: Secondary | ICD-10-CM | POA: Diagnosis not present

## 2019-09-22 DIAGNOSIS — J449 Chronic obstructive pulmonary disease, unspecified: Secondary | ICD-10-CM | POA: Diagnosis not present

## 2019-09-26 DIAGNOSIS — K219 Gastro-esophageal reflux disease without esophagitis: Secondary | ICD-10-CM | POA: Diagnosis not present

## 2019-09-26 DIAGNOSIS — J440 Chronic obstructive pulmonary disease with acute lower respiratory infection: Secondary | ICD-10-CM | POA: Diagnosis not present

## 2019-09-26 DIAGNOSIS — Z7982 Long term (current) use of aspirin: Secondary | ICD-10-CM | POA: Diagnosis not present

## 2019-09-26 DIAGNOSIS — E119 Type 2 diabetes mellitus without complications: Secondary | ICD-10-CM | POA: Diagnosis not present

## 2019-09-26 DIAGNOSIS — M1990 Unspecified osteoarthritis, unspecified site: Secondary | ICD-10-CM | POA: Diagnosis not present

## 2019-09-26 DIAGNOSIS — R05 Cough: Secondary | ICD-10-CM | POA: Diagnosis not present

## 2019-09-26 DIAGNOSIS — Z79899 Other long term (current) drug therapy: Secondary | ICD-10-CM | POA: Diagnosis not present

## 2019-09-26 DIAGNOSIS — Z8673 Personal history of transient ischemic attack (TIA), and cerebral infarction without residual deficits: Secondary | ICD-10-CM | POA: Diagnosis not present

## 2019-10-03 DIAGNOSIS — I951 Orthostatic hypotension: Secondary | ICD-10-CM | POA: Diagnosis not present

## 2019-10-03 DIAGNOSIS — Z7689 Persons encountering health services in other specified circumstances: Secondary | ICD-10-CM | POA: Diagnosis not present

## 2019-10-13 DIAGNOSIS — E785 Hyperlipidemia, unspecified: Secondary | ICD-10-CM | POA: Diagnosis not present

## 2019-10-13 DIAGNOSIS — E1140 Type 2 diabetes mellitus with diabetic neuropathy, unspecified: Secondary | ICD-10-CM | POA: Diagnosis not present

## 2019-10-13 DIAGNOSIS — N1831 Chronic kidney disease, stage 3a: Secondary | ICD-10-CM | POA: Diagnosis not present

## 2019-10-22 DIAGNOSIS — E1140 Type 2 diabetes mellitus with diabetic neuropathy, unspecified: Secondary | ICD-10-CM | POA: Diagnosis not present

## 2019-10-22 DIAGNOSIS — N1831 Chronic kidney disease, stage 3a: Secondary | ICD-10-CM | POA: Diagnosis not present

## 2019-10-22 DIAGNOSIS — E785 Hyperlipidemia, unspecified: Secondary | ICD-10-CM | POA: Diagnosis not present

## 2019-10-22 DIAGNOSIS — I951 Orthostatic hypotension: Secondary | ICD-10-CM | POA: Diagnosis not present

## 2019-10-30 DIAGNOSIS — I700 Atherosclerosis of aorta: Secondary | ICD-10-CM | POA: Diagnosis not present

## 2019-10-30 DIAGNOSIS — R05 Cough: Secondary | ICD-10-CM | POA: Diagnosis not present

## 2019-11-12 DIAGNOSIS — J301 Allergic rhinitis due to pollen: Secondary | ICD-10-CM | POA: Diagnosis not present

## 2019-11-12 DIAGNOSIS — G4733 Obstructive sleep apnea (adult) (pediatric): Secondary | ICD-10-CM | POA: Diagnosis not present

## 2019-11-12 DIAGNOSIS — J45991 Cough variant asthma: Secondary | ICD-10-CM | POA: Diagnosis not present

## 2019-11-12 DIAGNOSIS — K219 Gastro-esophageal reflux disease without esophagitis: Secondary | ICD-10-CM | POA: Diagnosis not present

## 2019-11-23 DIAGNOSIS — E1140 Type 2 diabetes mellitus with diabetic neuropathy, unspecified: Secondary | ICD-10-CM | POA: Diagnosis not present

## 2019-11-23 DIAGNOSIS — E785 Hyperlipidemia, unspecified: Secondary | ICD-10-CM | POA: Diagnosis not present

## 2019-11-23 DIAGNOSIS — I951 Orthostatic hypotension: Secondary | ICD-10-CM | POA: Diagnosis not present

## 2019-11-23 DIAGNOSIS — N1831 Chronic kidney disease, stage 3a: Secondary | ICD-10-CM | POA: Diagnosis not present

## 2019-12-04 DIAGNOSIS — K219 Gastro-esophageal reflux disease without esophagitis: Secondary | ICD-10-CM | POA: Diagnosis not present

## 2019-12-04 DIAGNOSIS — J301 Allergic rhinitis due to pollen: Secondary | ICD-10-CM | POA: Diagnosis not present

## 2019-12-04 DIAGNOSIS — J45991 Cough variant asthma: Secondary | ICD-10-CM | POA: Diagnosis not present

## 2019-12-09 DIAGNOSIS — J45991 Cough variant asthma: Secondary | ICD-10-CM | POA: Diagnosis not present

## 2019-12-23 DIAGNOSIS — E1140 Type 2 diabetes mellitus with diabetic neuropathy, unspecified: Secondary | ICD-10-CM | POA: Diagnosis not present

## 2019-12-23 DIAGNOSIS — N1831 Chronic kidney disease, stage 3a: Secondary | ICD-10-CM | POA: Diagnosis not present

## 2019-12-23 DIAGNOSIS — I951 Orthostatic hypotension: Secondary | ICD-10-CM | POA: Diagnosis not present

## 2019-12-23 DIAGNOSIS — E785 Hyperlipidemia, unspecified: Secondary | ICD-10-CM | POA: Diagnosis not present

## 2019-12-24 DIAGNOSIS — N1831 Chronic kidney disease, stage 3a: Secondary | ICD-10-CM | POA: Diagnosis not present

## 2019-12-24 DIAGNOSIS — E785 Hyperlipidemia, unspecified: Secondary | ICD-10-CM | POA: Diagnosis not present

## 2019-12-24 DIAGNOSIS — I951 Orthostatic hypotension: Secondary | ICD-10-CM | POA: Diagnosis not present

## 2019-12-24 DIAGNOSIS — E1140 Type 2 diabetes mellitus with diabetic neuropathy, unspecified: Secondary | ICD-10-CM | POA: Diagnosis not present

## 2019-12-26 DIAGNOSIS — I2510 Atherosclerotic heart disease of native coronary artery without angina pectoris: Secondary | ICD-10-CM | POA: Diagnosis not present

## 2019-12-26 DIAGNOSIS — I700 Atherosclerosis of aorta: Secondary | ICD-10-CM | POA: Diagnosis not present

## 2019-12-26 DIAGNOSIS — J9819 Other pulmonary collapse: Secondary | ICD-10-CM | POA: Diagnosis not present

## 2019-12-26 DIAGNOSIS — J8410 Pulmonary fibrosis, unspecified: Secondary | ICD-10-CM | POA: Diagnosis not present

## 2019-12-26 DIAGNOSIS — I7789 Other specified disorders of arteries and arterioles: Secondary | ICD-10-CM | POA: Diagnosis not present

## 2019-12-26 DIAGNOSIS — R918 Other nonspecific abnormal finding of lung field: Secondary | ICD-10-CM | POA: Diagnosis not present

## 2020-01-09 DIAGNOSIS — J45991 Cough variant asthma: Secondary | ICD-10-CM | POA: Diagnosis not present

## 2020-01-15 DIAGNOSIS — J45991 Cough variant asthma: Secondary | ICD-10-CM | POA: Diagnosis not present

## 2020-01-15 DIAGNOSIS — J301 Allergic rhinitis due to pollen: Secondary | ICD-10-CM | POA: Diagnosis not present

## 2020-01-15 DIAGNOSIS — K219 Gastro-esophageal reflux disease without esophagitis: Secondary | ICD-10-CM | POA: Diagnosis not present

## 2020-01-23 DIAGNOSIS — I951 Orthostatic hypotension: Secondary | ICD-10-CM | POA: Diagnosis not present

## 2020-01-23 DIAGNOSIS — E1140 Type 2 diabetes mellitus with diabetic neuropathy, unspecified: Secondary | ICD-10-CM | POA: Diagnosis not present

## 2020-01-23 DIAGNOSIS — E785 Hyperlipidemia, unspecified: Secondary | ICD-10-CM | POA: Diagnosis not present

## 2020-01-23 DIAGNOSIS — N1831 Chronic kidney disease, stage 3a: Secondary | ICD-10-CM | POA: Diagnosis not present

## 2020-02-05 DIAGNOSIS — E785 Hyperlipidemia, unspecified: Secondary | ICD-10-CM | POA: Diagnosis not present

## 2020-02-05 DIAGNOSIS — E1140 Type 2 diabetes mellitus with diabetic neuropathy, unspecified: Secondary | ICD-10-CM | POA: Diagnosis not present

## 2020-02-08 DIAGNOSIS — J45991 Cough variant asthma: Secondary | ICD-10-CM | POA: Diagnosis not present

## 2020-02-13 DIAGNOSIS — Z139 Encounter for screening, unspecified: Secondary | ICD-10-CM | POA: Diagnosis not present

## 2020-02-13 DIAGNOSIS — E1140 Type 2 diabetes mellitus with diabetic neuropathy, unspecified: Secondary | ICD-10-CM | POA: Diagnosis not present

## 2020-02-13 DIAGNOSIS — N1831 Chronic kidney disease, stage 3a: Secondary | ICD-10-CM | POA: Diagnosis not present

## 2020-02-13 DIAGNOSIS — Z23 Encounter for immunization: Secondary | ICD-10-CM | POA: Diagnosis not present

## 2020-02-13 DIAGNOSIS — J449 Chronic obstructive pulmonary disease, unspecified: Secondary | ICD-10-CM | POA: Diagnosis not present

## 2020-02-23 DIAGNOSIS — E1140 Type 2 diabetes mellitus with diabetic neuropathy, unspecified: Secondary | ICD-10-CM | POA: Diagnosis not present

## 2020-02-23 DIAGNOSIS — J449 Chronic obstructive pulmonary disease, unspecified: Secondary | ICD-10-CM | POA: Diagnosis not present

## 2020-02-23 DIAGNOSIS — Z139 Encounter for screening, unspecified: Secondary | ICD-10-CM | POA: Diagnosis not present

## 2020-02-23 DIAGNOSIS — N1831 Chronic kidney disease, stage 3a: Secondary | ICD-10-CM | POA: Diagnosis not present

## 2020-02-23 DIAGNOSIS — I129 Hypertensive chronic kidney disease with stage 1 through stage 4 chronic kidney disease, or unspecified chronic kidney disease: Secondary | ICD-10-CM | POA: Diagnosis not present

## 2020-03-10 DIAGNOSIS — J45991 Cough variant asthma: Secondary | ICD-10-CM | POA: Diagnosis not present

## 2020-04-05 DIAGNOSIS — S8002XA Contusion of left knee, initial encounter: Secondary | ICD-10-CM | POA: Diagnosis not present

## 2020-04-05 DIAGNOSIS — R9389 Abnormal findings on diagnostic imaging of other specified body structures: Secondary | ICD-10-CM | POA: Diagnosis not present

## 2020-04-05 DIAGNOSIS — I10 Essential (primary) hypertension: Secondary | ICD-10-CM | POA: Diagnosis not present

## 2020-04-09 DIAGNOSIS — J45991 Cough variant asthma: Secondary | ICD-10-CM | POA: Diagnosis not present

## 2020-04-24 DIAGNOSIS — N1831 Chronic kidney disease, stage 3a: Secondary | ICD-10-CM | POA: Diagnosis not present

## 2020-04-24 DIAGNOSIS — I129 Hypertensive chronic kidney disease with stage 1 through stage 4 chronic kidney disease, or unspecified chronic kidney disease: Secondary | ICD-10-CM | POA: Diagnosis not present

## 2020-04-24 DIAGNOSIS — E1140 Type 2 diabetes mellitus with diabetic neuropathy, unspecified: Secondary | ICD-10-CM | POA: Diagnosis not present

## 2020-04-24 DIAGNOSIS — I10 Essential (primary) hypertension: Secondary | ICD-10-CM | POA: Diagnosis not present

## 2020-05-25 DIAGNOSIS — N1831 Chronic kidney disease, stage 3a: Secondary | ICD-10-CM | POA: Diagnosis not present

## 2020-05-25 DIAGNOSIS — I10 Essential (primary) hypertension: Secondary | ICD-10-CM | POA: Diagnosis not present

## 2020-05-25 DIAGNOSIS — I129 Hypertensive chronic kidney disease with stage 1 through stage 4 chronic kidney disease, or unspecified chronic kidney disease: Secondary | ICD-10-CM | POA: Diagnosis not present

## 2020-05-25 DIAGNOSIS — E1140 Type 2 diabetes mellitus with diabetic neuropathy, unspecified: Secondary | ICD-10-CM | POA: Diagnosis not present

## 2020-06-10 DIAGNOSIS — J45991 Cough variant asthma: Secondary | ICD-10-CM | POA: Diagnosis not present

## 2020-06-14 DIAGNOSIS — E785 Hyperlipidemia, unspecified: Secondary | ICD-10-CM | POA: Diagnosis not present

## 2020-06-14 DIAGNOSIS — E1140 Type 2 diabetes mellitus with diabetic neuropathy, unspecified: Secondary | ICD-10-CM | POA: Diagnosis not present

## 2020-06-18 DIAGNOSIS — H25812 Combined forms of age-related cataract, left eye: Secondary | ICD-10-CM | POA: Diagnosis not present

## 2020-06-22 DIAGNOSIS — I129 Hypertensive chronic kidney disease with stage 1 through stage 4 chronic kidney disease, or unspecified chronic kidney disease: Secondary | ICD-10-CM | POA: Diagnosis not present

## 2020-06-22 DIAGNOSIS — E1140 Type 2 diabetes mellitus with diabetic neuropathy, unspecified: Secondary | ICD-10-CM | POA: Diagnosis not present

## 2020-06-22 DIAGNOSIS — N1831 Chronic kidney disease, stage 3a: Secondary | ICD-10-CM | POA: Diagnosis not present

## 2020-06-22 DIAGNOSIS — I10 Essential (primary) hypertension: Secondary | ICD-10-CM | POA: Diagnosis not present

## 2020-06-25 DIAGNOSIS — N1831 Chronic kidney disease, stage 3a: Secondary | ICD-10-CM | POA: Diagnosis not present

## 2020-06-25 DIAGNOSIS — Z139 Encounter for screening, unspecified: Secondary | ICD-10-CM | POA: Diagnosis not present

## 2020-06-25 DIAGNOSIS — Z0000 Encounter for general adult medical examination without abnormal findings: Secondary | ICD-10-CM | POA: Diagnosis not present

## 2020-06-25 DIAGNOSIS — Z23 Encounter for immunization: Secondary | ICD-10-CM | POA: Diagnosis not present

## 2020-06-25 DIAGNOSIS — M5416 Radiculopathy, lumbar region: Secondary | ICD-10-CM | POA: Diagnosis not present

## 2020-06-25 DIAGNOSIS — I129 Hypertensive chronic kidney disease with stage 1 through stage 4 chronic kidney disease, or unspecified chronic kidney disease: Secondary | ICD-10-CM | POA: Diagnosis not present

## 2020-06-25 DIAGNOSIS — I700 Atherosclerosis of aorta: Secondary | ICD-10-CM | POA: Diagnosis not present

## 2020-06-25 DIAGNOSIS — Z136 Encounter for screening for cardiovascular disorders: Secondary | ICD-10-CM | POA: Diagnosis not present

## 2020-07-05 DIAGNOSIS — R9389 Abnormal findings on diagnostic imaging of other specified body structures: Secondary | ICD-10-CM | POA: Diagnosis not present

## 2020-07-05 DIAGNOSIS — I270 Primary pulmonary hypertension: Secondary | ICD-10-CM | POA: Diagnosis not present

## 2020-07-05 DIAGNOSIS — J479 Bronchiectasis, uncomplicated: Secondary | ICD-10-CM | POA: Diagnosis not present

## 2020-07-05 DIAGNOSIS — I10 Essential (primary) hypertension: Secondary | ICD-10-CM | POA: Diagnosis not present

## 2020-07-05 DIAGNOSIS — I2510 Atherosclerotic heart disease of native coronary artery without angina pectoris: Secondary | ICD-10-CM | POA: Diagnosis not present

## 2020-07-08 DIAGNOSIS — J45991 Cough variant asthma: Secondary | ICD-10-CM | POA: Diagnosis not present

## 2020-07-23 DIAGNOSIS — N1831 Chronic kidney disease, stage 3a: Secondary | ICD-10-CM | POA: Diagnosis not present

## 2020-07-23 DIAGNOSIS — I700 Atherosclerosis of aorta: Secondary | ICD-10-CM | POA: Diagnosis not present

## 2020-07-23 DIAGNOSIS — I129 Hypertensive chronic kidney disease with stage 1 through stage 4 chronic kidney disease, or unspecified chronic kidney disease: Secondary | ICD-10-CM | POA: Diagnosis not present

## 2020-08-08 DIAGNOSIS — J45991 Cough variant asthma: Secondary | ICD-10-CM | POA: Diagnosis not present

## 2020-08-09 DIAGNOSIS — I700 Atherosclerosis of aorta: Secondary | ICD-10-CM | POA: Diagnosis not present

## 2020-08-09 DIAGNOSIS — N1831 Chronic kidney disease, stage 3a: Secondary | ICD-10-CM | POA: Diagnosis not present

## 2020-08-09 DIAGNOSIS — I129 Hypertensive chronic kidney disease with stage 1 through stage 4 chronic kidney disease, or unspecified chronic kidney disease: Secondary | ICD-10-CM | POA: Diagnosis not present

## 2020-08-09 DIAGNOSIS — J449 Chronic obstructive pulmonary disease, unspecified: Secondary | ICD-10-CM | POA: Diagnosis not present

## 2020-08-22 DIAGNOSIS — J449 Chronic obstructive pulmonary disease, unspecified: Secondary | ICD-10-CM | POA: Diagnosis not present

## 2020-08-22 DIAGNOSIS — E119 Type 2 diabetes mellitus without complications: Secondary | ICD-10-CM | POA: Diagnosis not present

## 2020-08-22 DIAGNOSIS — I129 Hypertensive chronic kidney disease with stage 1 through stage 4 chronic kidney disease, or unspecified chronic kidney disease: Secondary | ICD-10-CM | POA: Diagnosis not present

## 2020-09-07 DIAGNOSIS — J45991 Cough variant asthma: Secondary | ICD-10-CM | POA: Diagnosis not present

## 2020-09-22 DIAGNOSIS — E119 Type 2 diabetes mellitus without complications: Secondary | ICD-10-CM | POA: Diagnosis not present

## 2020-09-22 DIAGNOSIS — I129 Hypertensive chronic kidney disease with stage 1 through stage 4 chronic kidney disease, or unspecified chronic kidney disease: Secondary | ICD-10-CM | POA: Diagnosis not present

## 2020-10-08 DIAGNOSIS — J45991 Cough variant asthma: Secondary | ICD-10-CM | POA: Diagnosis not present

## 2020-10-22 DIAGNOSIS — I129 Hypertensive chronic kidney disease with stage 1 through stage 4 chronic kidney disease, or unspecified chronic kidney disease: Secondary | ICD-10-CM | POA: Diagnosis not present

## 2020-10-22 DIAGNOSIS — E119 Type 2 diabetes mellitus without complications: Secondary | ICD-10-CM | POA: Diagnosis not present

## 2020-11-07 DIAGNOSIS — J45991 Cough variant asthma: Secondary | ICD-10-CM | POA: Diagnosis not present

## 2020-11-16 DIAGNOSIS — M1712 Unilateral primary osteoarthritis, left knee: Secondary | ICD-10-CM | POA: Diagnosis not present

## 2020-11-22 DIAGNOSIS — I129 Hypertensive chronic kidney disease with stage 1 through stage 4 chronic kidney disease, or unspecified chronic kidney disease: Secondary | ICD-10-CM | POA: Diagnosis not present

## 2020-11-22 DIAGNOSIS — E119 Type 2 diabetes mellitus without complications: Secondary | ICD-10-CM | POA: Diagnosis not present

## 2020-12-06 DIAGNOSIS — E785 Hyperlipidemia, unspecified: Secondary | ICD-10-CM | POA: Diagnosis not present

## 2020-12-06 DIAGNOSIS — E1140 Type 2 diabetes mellitus with diabetic neuropathy, unspecified: Secondary | ICD-10-CM | POA: Diagnosis not present

## 2020-12-06 DIAGNOSIS — R42 Dizziness and giddiness: Secondary | ICD-10-CM | POA: Diagnosis not present

## 2020-12-13 DIAGNOSIS — R252 Cramp and spasm: Secondary | ICD-10-CM | POA: Diagnosis not present

## 2020-12-13 DIAGNOSIS — E1140 Type 2 diabetes mellitus with diabetic neuropathy, unspecified: Secondary | ICD-10-CM | POA: Diagnosis not present

## 2020-12-13 DIAGNOSIS — J449 Chronic obstructive pulmonary disease, unspecified: Secondary | ICD-10-CM | POA: Diagnosis not present

## 2020-12-23 DIAGNOSIS — E119 Type 2 diabetes mellitus without complications: Secondary | ICD-10-CM | POA: Diagnosis not present

## 2020-12-23 DIAGNOSIS — I129 Hypertensive chronic kidney disease with stage 1 through stage 4 chronic kidney disease, or unspecified chronic kidney disease: Secondary | ICD-10-CM | POA: Diagnosis not present

## 2021-01-22 DIAGNOSIS — E119 Type 2 diabetes mellitus without complications: Secondary | ICD-10-CM | POA: Diagnosis not present

## 2021-01-22 DIAGNOSIS — I129 Hypertensive chronic kidney disease with stage 1 through stage 4 chronic kidney disease, or unspecified chronic kidney disease: Secondary | ICD-10-CM | POA: Diagnosis not present

## 2021-01-28 DIAGNOSIS — Z23 Encounter for immunization: Secondary | ICD-10-CM | POA: Diagnosis not present

## 2021-02-22 DIAGNOSIS — E119 Type 2 diabetes mellitus without complications: Secondary | ICD-10-CM | POA: Diagnosis not present

## 2021-02-22 DIAGNOSIS — I129 Hypertensive chronic kidney disease with stage 1 through stage 4 chronic kidney disease, or unspecified chronic kidney disease: Secondary | ICD-10-CM | POA: Diagnosis not present

## 2021-03-01 DIAGNOSIS — E785 Hyperlipidemia, unspecified: Secondary | ICD-10-CM | POA: Diagnosis not present

## 2021-03-01 DIAGNOSIS — E1140 Type 2 diabetes mellitus with diabetic neuropathy, unspecified: Secondary | ICD-10-CM | POA: Diagnosis not present

## 2021-03-07 DIAGNOSIS — I10 Essential (primary) hypertension: Secondary | ICD-10-CM | POA: Diagnosis not present

## 2021-03-07 DIAGNOSIS — N390 Urinary tract infection, site not specified: Secondary | ICD-10-CM | POA: Diagnosis not present

## 2021-03-07 DIAGNOSIS — E1140 Type 2 diabetes mellitus with diabetic neuropathy, unspecified: Secondary | ICD-10-CM | POA: Diagnosis not present

## 2021-03-23 DIAGNOSIS — J029 Acute pharyngitis, unspecified: Secondary | ICD-10-CM | POA: Diagnosis not present

## 2021-03-23 DIAGNOSIS — J449 Chronic obstructive pulmonary disease, unspecified: Secondary | ICD-10-CM | POA: Diagnosis not present

## 2021-03-23 DIAGNOSIS — R059 Cough, unspecified: Secondary | ICD-10-CM | POA: Diagnosis not present

## 2021-03-23 DIAGNOSIS — Z20822 Contact with and (suspected) exposure to covid-19: Secondary | ICD-10-CM | POA: Diagnosis not present

## 2021-03-24 DIAGNOSIS — E119 Type 2 diabetes mellitus without complications: Secondary | ICD-10-CM | POA: Diagnosis not present

## 2021-03-24 DIAGNOSIS — I129 Hypertensive chronic kidney disease with stage 1 through stage 4 chronic kidney disease, or unspecified chronic kidney disease: Secondary | ICD-10-CM | POA: Diagnosis not present

## 2021-04-05 DIAGNOSIS — M1712 Unilateral primary osteoarthritis, left knee: Secondary | ICD-10-CM | POA: Diagnosis not present

## 2021-04-24 DIAGNOSIS — J449 Chronic obstructive pulmonary disease, unspecified: Secondary | ICD-10-CM | POA: Diagnosis not present

## 2021-04-24 DIAGNOSIS — E1140 Type 2 diabetes mellitus with diabetic neuropathy, unspecified: Secondary | ICD-10-CM | POA: Diagnosis not present

## 2021-04-24 DIAGNOSIS — E785 Hyperlipidemia, unspecified: Secondary | ICD-10-CM | POA: Diagnosis not present

## 2021-05-25 DIAGNOSIS — J449 Chronic obstructive pulmonary disease, unspecified: Secondary | ICD-10-CM | POA: Diagnosis not present

## 2021-05-25 DIAGNOSIS — E1140 Type 2 diabetes mellitus with diabetic neuropathy, unspecified: Secondary | ICD-10-CM | POA: Diagnosis not present

## 2021-05-25 DIAGNOSIS — E785 Hyperlipidemia, unspecified: Secondary | ICD-10-CM | POA: Diagnosis not present

## 2021-05-27 DIAGNOSIS — M1711 Unilateral primary osteoarthritis, right knee: Secondary | ICD-10-CM | POA: Diagnosis not present

## 2021-05-27 DIAGNOSIS — M25561 Pain in right knee: Secondary | ICD-10-CM | POA: Diagnosis not present

## 2021-05-27 DIAGNOSIS — M1712 Unilateral primary osteoarthritis, left knee: Secondary | ICD-10-CM | POA: Diagnosis not present

## 2021-05-30 DIAGNOSIS — E785 Hyperlipidemia, unspecified: Secondary | ICD-10-CM | POA: Diagnosis not present

## 2021-05-30 DIAGNOSIS — E1140 Type 2 diabetes mellitus with diabetic neuropathy, unspecified: Secondary | ICD-10-CM | POA: Diagnosis not present

## 2021-06-06 DIAGNOSIS — E1140 Type 2 diabetes mellitus with diabetic neuropathy, unspecified: Secondary | ICD-10-CM | POA: Diagnosis not present

## 2021-06-06 DIAGNOSIS — J449 Chronic obstructive pulmonary disease, unspecified: Secondary | ICD-10-CM | POA: Diagnosis not present

## 2021-06-22 DIAGNOSIS — J449 Chronic obstructive pulmonary disease, unspecified: Secondary | ICD-10-CM | POA: Diagnosis not present

## 2021-06-22 DIAGNOSIS — E785 Hyperlipidemia, unspecified: Secondary | ICD-10-CM | POA: Diagnosis not present

## 2021-06-22 DIAGNOSIS — E1140 Type 2 diabetes mellitus with diabetic neuropathy, unspecified: Secondary | ICD-10-CM | POA: Diagnosis not present

## 2021-07-23 DIAGNOSIS — E1129 Type 2 diabetes mellitus with other diabetic kidney complication: Secondary | ICD-10-CM | POA: Diagnosis not present

## 2021-08-02 DIAGNOSIS — G4700 Insomnia, unspecified: Secondary | ICD-10-CM | POA: Diagnosis not present

## 2021-08-02 DIAGNOSIS — N390 Urinary tract infection, site not specified: Secondary | ICD-10-CM | POA: Diagnosis not present

## 2021-08-10 DIAGNOSIS — S52591A Other fractures of lower end of right radius, initial encounter for closed fracture: Secondary | ICD-10-CM | POA: Diagnosis not present

## 2021-08-10 DIAGNOSIS — I959 Hypotension, unspecified: Secondary | ICD-10-CM | POA: Diagnosis not present

## 2021-08-10 DIAGNOSIS — S6991XA Unspecified injury of right wrist, hand and finger(s), initial encounter: Secondary | ICD-10-CM | POA: Diagnosis not present

## 2021-08-10 DIAGNOSIS — Z743 Need for continuous supervision: Secondary | ICD-10-CM | POA: Diagnosis not present

## 2021-08-10 DIAGNOSIS — S52501A Unspecified fracture of the lower end of right radius, initial encounter for closed fracture: Secondary | ICD-10-CM | POA: Diagnosis not present

## 2021-08-10 DIAGNOSIS — Z01818 Encounter for other preprocedural examination: Secondary | ICD-10-CM | POA: Diagnosis not present

## 2021-08-10 DIAGNOSIS — S52101A Unspecified fracture of upper end of right radius, initial encounter for closed fracture: Secondary | ICD-10-CM | POA: Diagnosis not present

## 2021-08-10 DIAGNOSIS — S52571A Other intraarticular fracture of lower end of right radius, initial encounter for closed fracture: Secondary | ICD-10-CM | POA: Diagnosis not present

## 2021-08-10 DIAGNOSIS — S52001A Unspecified fracture of upper end of right ulna, initial encounter for closed fracture: Secondary | ICD-10-CM | POA: Diagnosis not present

## 2021-08-10 DIAGNOSIS — S52601A Unspecified fracture of lower end of right ulna, initial encounter for closed fracture: Secondary | ICD-10-CM | POA: Diagnosis not present

## 2021-08-10 DIAGNOSIS — W010XXA Fall on same level from slipping, tripping and stumbling without subsequent striking against object, initial encounter: Secondary | ICD-10-CM | POA: Diagnosis not present

## 2021-08-10 DIAGNOSIS — Y999 Unspecified external cause status: Secondary | ICD-10-CM | POA: Diagnosis not present

## 2021-08-10 DIAGNOSIS — S52691A Other fracture of lower end of right ulna, initial encounter for closed fracture: Secondary | ICD-10-CM | POA: Diagnosis not present

## 2021-08-17 DIAGNOSIS — S52501A Unspecified fracture of the lower end of right radius, initial encounter for closed fracture: Secondary | ICD-10-CM | POA: Diagnosis not present

## 2021-08-17 DIAGNOSIS — M25531 Pain in right wrist: Secondary | ICD-10-CM | POA: Diagnosis not present

## 2021-08-17 DIAGNOSIS — S52601A Unspecified fracture of lower end of right ulna, initial encounter for closed fracture: Secondary | ICD-10-CM | POA: Diagnosis not present

## 2021-08-22 DIAGNOSIS — E1129 Type 2 diabetes mellitus with other diabetic kidney complication: Secondary | ICD-10-CM | POA: Diagnosis not present

## 2021-08-29 DIAGNOSIS — S5292XG Unspecified fracture of left forearm, subsequent encounter for closed fracture with delayed healing: Secondary | ICD-10-CM | POA: Diagnosis not present

## 2021-08-29 DIAGNOSIS — N390 Urinary tract infection, site not specified: Secondary | ICD-10-CM | POA: Diagnosis not present

## 2021-08-29 DIAGNOSIS — S52601A Unspecified fracture of lower end of right ulna, initial encounter for closed fracture: Secondary | ICD-10-CM | POA: Diagnosis not present

## 2021-08-29 DIAGNOSIS — S52501A Unspecified fracture of the lower end of right radius, initial encounter for closed fracture: Secondary | ICD-10-CM | POA: Diagnosis not present

## 2021-08-29 DIAGNOSIS — R269 Unspecified abnormalities of gait and mobility: Secondary | ICD-10-CM | POA: Diagnosis not present

## 2021-08-29 DIAGNOSIS — S52202G Unspecified fracture of shaft of left ulna, subsequent encounter for closed fracture with delayed healing: Secondary | ICD-10-CM | POA: Diagnosis not present

## 2021-09-09 DIAGNOSIS — S52601D Unspecified fracture of lower end of right ulna, subsequent encounter for closed fracture with routine healing: Secondary | ICD-10-CM | POA: Diagnosis not present

## 2021-09-09 DIAGNOSIS — S52501D Unspecified fracture of the lower end of right radius, subsequent encounter for closed fracture with routine healing: Secondary | ICD-10-CM | POA: Diagnosis not present

## 2021-09-22 DIAGNOSIS — E1129 Type 2 diabetes mellitus with other diabetic kidney complication: Secondary | ICD-10-CM | POA: Diagnosis not present

## 2021-10-03 DIAGNOSIS — S52501D Unspecified fracture of the lower end of right radius, subsequent encounter for closed fracture with routine healing: Secondary | ICD-10-CM | POA: Diagnosis not present

## 2021-10-03 DIAGNOSIS — S52601D Unspecified fracture of lower end of right ulna, subsequent encounter for closed fracture with routine healing: Secondary | ICD-10-CM | POA: Diagnosis not present

## 2021-10-06 DIAGNOSIS — E785 Hyperlipidemia, unspecified: Secondary | ICD-10-CM | POA: Diagnosis not present

## 2021-10-06 DIAGNOSIS — E1140 Type 2 diabetes mellitus with diabetic neuropathy, unspecified: Secondary | ICD-10-CM | POA: Diagnosis not present

## 2021-10-11 DIAGNOSIS — Z1389 Encounter for screening for other disorder: Secondary | ICD-10-CM | POA: Diagnosis not present

## 2021-10-11 DIAGNOSIS — Z139 Encounter for screening, unspecified: Secondary | ICD-10-CM | POA: Diagnosis not present

## 2021-10-11 DIAGNOSIS — E1140 Type 2 diabetes mellitus with diabetic neuropathy, unspecified: Secondary | ICD-10-CM | POA: Diagnosis not present

## 2021-10-11 DIAGNOSIS — N390 Urinary tract infection, site not specified: Secondary | ICD-10-CM | POA: Diagnosis not present

## 2021-10-11 DIAGNOSIS — Z0000 Encounter for general adult medical examination without abnormal findings: Secondary | ICD-10-CM | POA: Diagnosis not present

## 2021-10-11 DIAGNOSIS — Z136 Encounter for screening for cardiovascular disorders: Secondary | ICD-10-CM | POA: Diagnosis not present

## 2021-10-11 DIAGNOSIS — Z0189 Encounter for other specified special examinations: Secondary | ICD-10-CM | POA: Diagnosis not present

## 2021-10-11 DIAGNOSIS — E785 Hyperlipidemia, unspecified: Secondary | ICD-10-CM | POA: Diagnosis not present

## 2021-10-22 DIAGNOSIS — E1129 Type 2 diabetes mellitus with other diabetic kidney complication: Secondary | ICD-10-CM | POA: Diagnosis not present

## 2021-11-22 DIAGNOSIS — E1129 Type 2 diabetes mellitus with other diabetic kidney complication: Secondary | ICD-10-CM | POA: Diagnosis not present

## 2021-11-23 DIAGNOSIS — R269 Unspecified abnormalities of gait and mobility: Secondary | ICD-10-CM | POA: Diagnosis not present

## 2021-11-23 DIAGNOSIS — J45991 Cough variant asthma: Secondary | ICD-10-CM | POA: Diagnosis not present

## 2021-12-05 DIAGNOSIS — M1712 Unilateral primary osteoarthritis, left knee: Secondary | ICD-10-CM | POA: Diagnosis not present

## 2021-12-23 DIAGNOSIS — E1129 Type 2 diabetes mellitus with other diabetic kidney complication: Secondary | ICD-10-CM | POA: Diagnosis not present

## 2021-12-29 DIAGNOSIS — M1712 Unilateral primary osteoarthritis, left knee: Secondary | ICD-10-CM | POA: Diagnosis not present

## 2022-01-05 DIAGNOSIS — M1712 Unilateral primary osteoarthritis, left knee: Secondary | ICD-10-CM | POA: Diagnosis not present

## 2022-01-12 DIAGNOSIS — M1712 Unilateral primary osteoarthritis, left knee: Secondary | ICD-10-CM | POA: Diagnosis not present

## 2022-01-22 DIAGNOSIS — E1129 Type 2 diabetes mellitus with other diabetic kidney complication: Secondary | ICD-10-CM | POA: Diagnosis not present

## 2022-02-14 DIAGNOSIS — E1129 Type 2 diabetes mellitus with other diabetic kidney complication: Secondary | ICD-10-CM | POA: Diagnosis not present

## 2022-02-14 DIAGNOSIS — I129 Hypertensive chronic kidney disease with stage 1 through stage 4 chronic kidney disease, or unspecified chronic kidney disease: Secondary | ICD-10-CM | POA: Diagnosis not present

## 2022-02-14 DIAGNOSIS — E785 Hyperlipidemia, unspecified: Secondary | ICD-10-CM | POA: Diagnosis not present

## 2022-02-21 DIAGNOSIS — G8929 Other chronic pain: Secondary | ICD-10-CM | POA: Diagnosis not present

## 2022-02-21 DIAGNOSIS — Z23 Encounter for immunization: Secondary | ICD-10-CM | POA: Diagnosis not present

## 2022-02-21 DIAGNOSIS — M542 Cervicalgia: Secondary | ICD-10-CM | POA: Diagnosis not present

## 2022-02-21 DIAGNOSIS — E785 Hyperlipidemia, unspecified: Secondary | ICD-10-CM | POA: Diagnosis not present

## 2022-02-21 DIAGNOSIS — E1129 Type 2 diabetes mellitus with other diabetic kidney complication: Secondary | ICD-10-CM | POA: Diagnosis not present

## 2022-02-22 DIAGNOSIS — E1129 Type 2 diabetes mellitus with other diabetic kidney complication: Secondary | ICD-10-CM | POA: Diagnosis not present

## 2022-02-24 DIAGNOSIS — M25561 Pain in right knee: Secondary | ICD-10-CM | POA: Diagnosis not present

## 2022-02-24 DIAGNOSIS — M1711 Unilateral primary osteoarthritis, right knee: Secondary | ICD-10-CM | POA: Diagnosis not present

## 2022-03-24 DIAGNOSIS — E1129 Type 2 diabetes mellitus with other diabetic kidney complication: Secondary | ICD-10-CM | POA: Diagnosis not present

## 2022-03-24 DIAGNOSIS — I129 Hypertensive chronic kidney disease with stage 1 through stage 4 chronic kidney disease, or unspecified chronic kidney disease: Secondary | ICD-10-CM | POA: Diagnosis not present

## 2022-04-24 DIAGNOSIS — E1129 Type 2 diabetes mellitus with other diabetic kidney complication: Secondary | ICD-10-CM | POA: Diagnosis not present

## 2022-04-24 DIAGNOSIS — I129 Hypertensive chronic kidney disease with stage 1 through stage 4 chronic kidney disease, or unspecified chronic kidney disease: Secondary | ICD-10-CM | POA: Diagnosis not present

## 2022-05-04 DIAGNOSIS — Z20822 Contact with and (suspected) exposure to covid-19: Secondary | ICD-10-CM | POA: Diagnosis not present

## 2022-05-04 DIAGNOSIS — R051 Acute cough: Secondary | ICD-10-CM | POA: Diagnosis not present

## 2022-05-04 DIAGNOSIS — J449 Chronic obstructive pulmonary disease, unspecified: Secondary | ICD-10-CM | POA: Diagnosis not present

## 2022-05-05 DIAGNOSIS — R051 Acute cough: Secondary | ICD-10-CM | POA: Diagnosis not present

## 2022-05-05 DIAGNOSIS — S2231XA Fracture of one rib, right side, initial encounter for closed fracture: Secondary | ICD-10-CM | POA: Diagnosis not present

## 2022-05-05 DIAGNOSIS — R059 Cough, unspecified: Secondary | ICD-10-CM | POA: Diagnosis not present

## 2022-05-18 DIAGNOSIS — E1129 Type 2 diabetes mellitus with other diabetic kidney complication: Secondary | ICD-10-CM | POA: Diagnosis not present

## 2022-05-18 DIAGNOSIS — I129 Hypertensive chronic kidney disease with stage 1 through stage 4 chronic kidney disease, or unspecified chronic kidney disease: Secondary | ICD-10-CM | POA: Diagnosis not present

## 2022-05-18 DIAGNOSIS — E785 Hyperlipidemia, unspecified: Secondary | ICD-10-CM | POA: Diagnosis not present

## 2022-05-23 DIAGNOSIS — E785 Hyperlipidemia, unspecified: Secondary | ICD-10-CM | POA: Diagnosis not present

## 2022-05-23 DIAGNOSIS — Z9181 History of falling: Secondary | ICD-10-CM | POA: Diagnosis not present

## 2022-05-23 DIAGNOSIS — I10 Essential (primary) hypertension: Secondary | ICD-10-CM | POA: Diagnosis not present

## 2022-05-23 DIAGNOSIS — I129 Hypertensive chronic kidney disease with stage 1 through stage 4 chronic kidney disease, or unspecified chronic kidney disease: Secondary | ICD-10-CM | POA: Diagnosis not present

## 2022-05-23 DIAGNOSIS — N183 Chronic kidney disease, stage 3 unspecified: Secondary | ICD-10-CM | POA: Diagnosis not present

## 2022-05-23 DIAGNOSIS — E1129 Type 2 diabetes mellitus with other diabetic kidney complication: Secondary | ICD-10-CM | POA: Diagnosis not present

## 2022-05-25 DIAGNOSIS — E785 Hyperlipidemia, unspecified: Secondary | ICD-10-CM | POA: Diagnosis not present

## 2022-06-15 DIAGNOSIS — R0989 Other specified symptoms and signs involving the circulatory and respiratory systems: Secondary | ICD-10-CM | POA: Diagnosis not present

## 2022-06-15 DIAGNOSIS — J449 Chronic obstructive pulmonary disease, unspecified: Secondary | ICD-10-CM | POA: Diagnosis not present

## 2022-06-15 DIAGNOSIS — Z20822 Contact with and (suspected) exposure to covid-19: Secondary | ICD-10-CM | POA: Diagnosis not present

## 2022-06-15 DIAGNOSIS — I10 Essential (primary) hypertension: Secondary | ICD-10-CM | POA: Diagnosis not present

## 2022-06-15 DIAGNOSIS — R059 Cough, unspecified: Secondary | ICD-10-CM | POA: Diagnosis not present

## 2022-06-22 DIAGNOSIS — J449 Chronic obstructive pulmonary disease, unspecified: Secondary | ICD-10-CM | POA: Diagnosis not present

## 2022-06-22 DIAGNOSIS — R059 Cough, unspecified: Secondary | ICD-10-CM | POA: Diagnosis not present

## 2022-06-22 DIAGNOSIS — I10 Essential (primary) hypertension: Secondary | ICD-10-CM | POA: Diagnosis not present

## 2022-06-22 DIAGNOSIS — R0989 Other specified symptoms and signs involving the circulatory and respiratory systems: Secondary | ICD-10-CM | POA: Diagnosis not present

## 2022-06-23 DIAGNOSIS — I10 Essential (primary) hypertension: Secondary | ICD-10-CM | POA: Diagnosis not present

## 2022-06-23 DIAGNOSIS — J449 Chronic obstructive pulmonary disease, unspecified: Secondary | ICD-10-CM | POA: Diagnosis not present

## 2022-06-26 DIAGNOSIS — M1712 Unilateral primary osteoarthritis, left knee: Secondary | ICD-10-CM | POA: Diagnosis not present

## 2022-07-24 DIAGNOSIS — J449 Chronic obstructive pulmonary disease, unspecified: Secondary | ICD-10-CM | POA: Diagnosis not present

## 2022-07-24 DIAGNOSIS — I10 Essential (primary) hypertension: Secondary | ICD-10-CM | POA: Diagnosis not present

## 2022-07-25 DIAGNOSIS — E1129 Type 2 diabetes mellitus with other diabetic kidney complication: Secondary | ICD-10-CM | POA: Diagnosis not present

## 2022-07-25 DIAGNOSIS — R82998 Other abnormal findings in urine: Secondary | ICD-10-CM | POA: Diagnosis not present

## 2022-07-25 DIAGNOSIS — N390 Urinary tract infection, site not specified: Secondary | ICD-10-CM | POA: Diagnosis not present

## 2022-08-22 DIAGNOSIS — E1129 Type 2 diabetes mellitus with other diabetic kidney complication: Secondary | ICD-10-CM | POA: Diagnosis not present

## 2022-08-22 DIAGNOSIS — I129 Hypertensive chronic kidney disease with stage 1 through stage 4 chronic kidney disease, or unspecified chronic kidney disease: Secondary | ICD-10-CM | POA: Diagnosis not present

## 2022-08-22 DIAGNOSIS — E785 Hyperlipidemia, unspecified: Secondary | ICD-10-CM | POA: Diagnosis not present

## 2022-08-23 DIAGNOSIS — E1129 Type 2 diabetes mellitus with other diabetic kidney complication: Secondary | ICD-10-CM | POA: Diagnosis not present

## 2022-08-23 DIAGNOSIS — J449 Chronic obstructive pulmonary disease, unspecified: Secondary | ICD-10-CM | POA: Diagnosis not present

## 2022-08-29 DIAGNOSIS — I129 Hypertensive chronic kidney disease with stage 1 through stage 4 chronic kidney disease, or unspecified chronic kidney disease: Secondary | ICD-10-CM | POA: Diagnosis not present

## 2022-08-29 DIAGNOSIS — E1129 Type 2 diabetes mellitus with other diabetic kidney complication: Secondary | ICD-10-CM | POA: Diagnosis not present

## 2022-08-29 DIAGNOSIS — N183 Chronic kidney disease, stage 3 unspecified: Secondary | ICD-10-CM | POA: Diagnosis not present

## 2022-08-29 DIAGNOSIS — E785 Hyperlipidemia, unspecified: Secondary | ICD-10-CM | POA: Diagnosis not present

## 2022-09-23 DIAGNOSIS — E785 Hyperlipidemia, unspecified: Secondary | ICD-10-CM | POA: Diagnosis not present

## 2022-10-23 DIAGNOSIS — E785 Hyperlipidemia, unspecified: Secondary | ICD-10-CM | POA: Diagnosis not present

## 2022-10-25 DIAGNOSIS — M1712 Unilateral primary osteoarthritis, left knee: Secondary | ICD-10-CM | POA: Diagnosis not present

## 2022-10-25 DIAGNOSIS — R300 Dysuria: Secondary | ICD-10-CM | POA: Diagnosis not present

## 2022-10-25 DIAGNOSIS — N390 Urinary tract infection, site not specified: Secondary | ICD-10-CM | POA: Diagnosis not present

## 2022-10-31 DIAGNOSIS — Z79899 Other long term (current) drug therapy: Secondary | ICD-10-CM | POA: Diagnosis not present

## 2022-10-31 DIAGNOSIS — Z7982 Long term (current) use of aspirin: Secondary | ICD-10-CM | POA: Diagnosis not present

## 2022-10-31 DIAGNOSIS — B9689 Other specified bacterial agents as the cause of diseases classified elsewhere: Secondary | ICD-10-CM | POA: Diagnosis not present

## 2022-10-31 DIAGNOSIS — I10 Essential (primary) hypertension: Secondary | ICD-10-CM | POA: Diagnosis not present

## 2022-10-31 DIAGNOSIS — J449 Chronic obstructive pulmonary disease, unspecified: Secondary | ICD-10-CM | POA: Diagnosis not present

## 2022-10-31 DIAGNOSIS — Z8673 Personal history of transient ischemic attack (TIA), and cerebral infarction without residual deficits: Secondary | ICD-10-CM | POA: Diagnosis not present

## 2022-10-31 DIAGNOSIS — Z9181 History of falling: Secondary | ICD-10-CM | POA: Diagnosis not present

## 2022-10-31 DIAGNOSIS — N3000 Acute cystitis without hematuria: Secondary | ICD-10-CM | POA: Diagnosis not present

## 2022-10-31 DIAGNOSIS — N3020 Other chronic cystitis without hematuria: Secondary | ICD-10-CM | POA: Diagnosis not present

## 2022-10-31 DIAGNOSIS — Z792 Long term (current) use of antibiotics: Secondary | ICD-10-CM | POA: Diagnosis not present

## 2022-10-31 DIAGNOSIS — E785 Hyperlipidemia, unspecified: Secondary | ICD-10-CM | POA: Diagnosis not present

## 2022-10-31 DIAGNOSIS — B952 Enterococcus as the cause of diseases classified elsewhere: Secondary | ICD-10-CM | POA: Diagnosis not present

## 2022-10-31 DIAGNOSIS — Z1624 Resistance to multiple antibiotics: Secondary | ICD-10-CM | POA: Diagnosis not present

## 2022-11-01 DIAGNOSIS — N3000 Acute cystitis without hematuria: Secondary | ICD-10-CM | POA: Diagnosis not present

## 2022-11-01 DIAGNOSIS — B952 Enterococcus as the cause of diseases classified elsewhere: Secondary | ICD-10-CM | POA: Diagnosis not present

## 2022-11-01 DIAGNOSIS — Z792 Long term (current) use of antibiotics: Secondary | ICD-10-CM | POA: Diagnosis not present

## 2022-11-01 DIAGNOSIS — J449 Chronic obstructive pulmonary disease, unspecified: Secondary | ICD-10-CM | POA: Diagnosis not present

## 2022-11-01 DIAGNOSIS — N3020 Other chronic cystitis without hematuria: Secondary | ICD-10-CM | POA: Diagnosis not present

## 2022-11-01 DIAGNOSIS — Z8673 Personal history of transient ischemic attack (TIA), and cerebral infarction without residual deficits: Secondary | ICD-10-CM | POA: Diagnosis not present

## 2022-11-01 DIAGNOSIS — Z7982 Long term (current) use of aspirin: Secondary | ICD-10-CM | POA: Diagnosis not present

## 2022-11-01 DIAGNOSIS — Z1624 Resistance to multiple antibiotics: Secondary | ICD-10-CM | POA: Diagnosis not present

## 2022-11-01 DIAGNOSIS — B9689 Other specified bacterial agents as the cause of diseases classified elsewhere: Secondary | ICD-10-CM | POA: Diagnosis not present

## 2022-11-01 DIAGNOSIS — Z79899 Other long term (current) drug therapy: Secondary | ICD-10-CM | POA: Diagnosis not present

## 2022-11-01 DIAGNOSIS — E785 Hyperlipidemia, unspecified: Secondary | ICD-10-CM | POA: Diagnosis not present

## 2022-11-01 DIAGNOSIS — I10 Essential (primary) hypertension: Secondary | ICD-10-CM | POA: Diagnosis not present

## 2022-11-01 DIAGNOSIS — Z9181 History of falling: Secondary | ICD-10-CM | POA: Diagnosis not present

## 2022-11-02 DIAGNOSIS — N3020 Other chronic cystitis without hematuria: Secondary | ICD-10-CM | POA: Diagnosis not present

## 2022-11-02 DIAGNOSIS — J449 Chronic obstructive pulmonary disease, unspecified: Secondary | ICD-10-CM | POA: Diagnosis not present

## 2022-11-02 DIAGNOSIS — Z792 Long term (current) use of antibiotics: Secondary | ICD-10-CM | POA: Diagnosis not present

## 2022-11-02 DIAGNOSIS — Z9181 History of falling: Secondary | ICD-10-CM | POA: Diagnosis not present

## 2022-11-02 DIAGNOSIS — I10 Essential (primary) hypertension: Secondary | ICD-10-CM | POA: Diagnosis not present

## 2022-11-02 DIAGNOSIS — B952 Enterococcus as the cause of diseases classified elsewhere: Secondary | ICD-10-CM | POA: Diagnosis not present

## 2022-11-02 DIAGNOSIS — Z8673 Personal history of transient ischemic attack (TIA), and cerebral infarction without residual deficits: Secondary | ICD-10-CM | POA: Diagnosis not present

## 2022-11-02 DIAGNOSIS — Z7982 Long term (current) use of aspirin: Secondary | ICD-10-CM | POA: Diagnosis not present

## 2022-11-02 DIAGNOSIS — N3000 Acute cystitis without hematuria: Secondary | ICD-10-CM | POA: Diagnosis not present

## 2022-11-02 DIAGNOSIS — Z79899 Other long term (current) drug therapy: Secondary | ICD-10-CM | POA: Diagnosis not present

## 2022-11-02 DIAGNOSIS — B9689 Other specified bacterial agents as the cause of diseases classified elsewhere: Secondary | ICD-10-CM | POA: Diagnosis not present

## 2022-11-02 DIAGNOSIS — E785 Hyperlipidemia, unspecified: Secondary | ICD-10-CM | POA: Diagnosis not present

## 2022-11-02 DIAGNOSIS — Z1624 Resistance to multiple antibiotics: Secondary | ICD-10-CM | POA: Diagnosis not present

## 2022-11-16 DIAGNOSIS — L508 Other urticaria: Secondary | ICD-10-CM | POA: Diagnosis not present

## 2022-11-21 DIAGNOSIS — L508 Other urticaria: Secondary | ICD-10-CM | POA: Diagnosis not present

## 2022-11-23 DIAGNOSIS — E785 Hyperlipidemia, unspecified: Secondary | ICD-10-CM | POA: Diagnosis not present

## 2022-12-24 DIAGNOSIS — E785 Hyperlipidemia, unspecified: Secondary | ICD-10-CM | POA: Diagnosis not present

## 2022-12-26 DIAGNOSIS — E1140 Type 2 diabetes mellitus with diabetic neuropathy, unspecified: Secondary | ICD-10-CM | POA: Diagnosis not present

## 2022-12-26 DIAGNOSIS — I129 Hypertensive chronic kidney disease with stage 1 through stage 4 chronic kidney disease, or unspecified chronic kidney disease: Secondary | ICD-10-CM | POA: Diagnosis not present

## 2022-12-26 DIAGNOSIS — E785 Hyperlipidemia, unspecified: Secondary | ICD-10-CM | POA: Diagnosis not present

## 2022-12-28 DIAGNOSIS — E1140 Type 2 diabetes mellitus with diabetic neuropathy, unspecified: Secondary | ICD-10-CM | POA: Diagnosis not present

## 2022-12-28 DIAGNOSIS — Z23 Encounter for immunization: Secondary | ICD-10-CM | POA: Diagnosis not present

## 2023-01-02 DIAGNOSIS — N183 Chronic kidney disease, stage 3 unspecified: Secondary | ICD-10-CM | POA: Diagnosis not present

## 2023-01-02 DIAGNOSIS — Z23 Encounter for immunization: Secondary | ICD-10-CM | POA: Diagnosis not present

## 2023-01-02 DIAGNOSIS — E1140 Type 2 diabetes mellitus with diabetic neuropathy, unspecified: Secondary | ICD-10-CM | POA: Diagnosis not present

## 2023-01-02 DIAGNOSIS — I129 Hypertensive chronic kidney disease with stage 1 through stage 4 chronic kidney disease, or unspecified chronic kidney disease: Secondary | ICD-10-CM | POA: Diagnosis not present

## 2023-01-23 DIAGNOSIS — E785 Hyperlipidemia, unspecified: Secondary | ICD-10-CM | POA: Diagnosis not present

## 2023-02-23 DIAGNOSIS — E785 Hyperlipidemia, unspecified: Secondary | ICD-10-CM | POA: Diagnosis not present

## 2023-03-25 DIAGNOSIS — E785 Hyperlipidemia, unspecified: Secondary | ICD-10-CM | POA: Diagnosis not present

## 2023-04-04 DIAGNOSIS — M1712 Unilateral primary osteoarthritis, left knee: Secondary | ICD-10-CM | POA: Diagnosis not present

## 2023-04-25 DIAGNOSIS — E785 Hyperlipidemia, unspecified: Secondary | ICD-10-CM | POA: Diagnosis not present

## 2023-04-27 DIAGNOSIS — Z23 Encounter for immunization: Secondary | ICD-10-CM | POA: Diagnosis not present

## 2023-05-26 DIAGNOSIS — E785 Hyperlipidemia, unspecified: Secondary | ICD-10-CM | POA: Diagnosis not present

## 2023-06-23 DIAGNOSIS — E785 Hyperlipidemia, unspecified: Secondary | ICD-10-CM | POA: Diagnosis not present

## 2023-06-26 DIAGNOSIS — I129 Hypertensive chronic kidney disease with stage 1 through stage 4 chronic kidney disease, or unspecified chronic kidney disease: Secondary | ICD-10-CM | POA: Diagnosis not present

## 2023-06-26 DIAGNOSIS — E785 Hyperlipidemia, unspecified: Secondary | ICD-10-CM | POA: Diagnosis not present

## 2023-06-26 DIAGNOSIS — E1140 Type 2 diabetes mellitus with diabetic neuropathy, unspecified: Secondary | ICD-10-CM | POA: Diagnosis not present

## 2023-07-03 DIAGNOSIS — N183 Chronic kidney disease, stage 3 unspecified: Secondary | ICD-10-CM | POA: Diagnosis not present

## 2023-07-03 DIAGNOSIS — Z136 Encounter for screening for cardiovascular disorders: Secondary | ICD-10-CM | POA: Diagnosis not present

## 2023-07-03 DIAGNOSIS — E785 Hyperlipidemia, unspecified: Secondary | ICD-10-CM | POA: Diagnosis not present

## 2023-07-03 DIAGNOSIS — I129 Hypertensive chronic kidney disease with stage 1 through stage 4 chronic kidney disease, or unspecified chronic kidney disease: Secondary | ICD-10-CM | POA: Diagnosis not present

## 2023-07-03 DIAGNOSIS — Z0000 Encounter for general adult medical examination without abnormal findings: Secondary | ICD-10-CM | POA: Diagnosis not present

## 2023-07-03 DIAGNOSIS — E1140 Type 2 diabetes mellitus with diabetic neuropathy, unspecified: Secondary | ICD-10-CM | POA: Diagnosis not present

## 2023-07-03 DIAGNOSIS — Z139 Encounter for screening, unspecified: Secondary | ICD-10-CM | POA: Diagnosis not present

## 2023-07-24 DIAGNOSIS — R9389 Abnormal findings on diagnostic imaging of other specified body structures: Secondary | ICD-10-CM | POA: Diagnosis not present

## 2023-07-24 DIAGNOSIS — E785 Hyperlipidemia, unspecified: Secondary | ICD-10-CM | POA: Diagnosis not present

## 2023-07-31 DIAGNOSIS — I10 Essential (primary) hypertension: Secondary | ICD-10-CM | POA: Diagnosis not present

## 2023-07-31 DIAGNOSIS — N1831 Chronic kidney disease, stage 3a: Secondary | ICD-10-CM | POA: Diagnosis not present

## 2023-09-06 DIAGNOSIS — M1712 Unilateral primary osteoarthritis, left knee: Secondary | ICD-10-CM | POA: Diagnosis not present

## 2023-09-06 DIAGNOSIS — M1711 Unilateral primary osteoarthritis, right knee: Secondary | ICD-10-CM | POA: Diagnosis not present

## 2023-09-23 DIAGNOSIS — E785 Hyperlipidemia, unspecified: Secondary | ICD-10-CM | POA: Diagnosis not present

## 2023-09-23 DIAGNOSIS — R9389 Abnormal findings on diagnostic imaging of other specified body structures: Secondary | ICD-10-CM | POA: Diagnosis not present

## 2023-10-02 DIAGNOSIS — I129 Hypertensive chronic kidney disease with stage 1 through stage 4 chronic kidney disease, or unspecified chronic kidney disease: Secondary | ICD-10-CM | POA: Diagnosis not present

## 2023-10-02 DIAGNOSIS — E785 Hyperlipidemia, unspecified: Secondary | ICD-10-CM | POA: Diagnosis not present

## 2023-10-02 DIAGNOSIS — E1140 Type 2 diabetes mellitus with diabetic neuropathy, unspecified: Secondary | ICD-10-CM | POA: Diagnosis not present

## 2023-10-09 DIAGNOSIS — E1140 Type 2 diabetes mellitus with diabetic neuropathy, unspecified: Secondary | ICD-10-CM | POA: Diagnosis not present

## 2023-10-09 DIAGNOSIS — I10 Essential (primary) hypertension: Secondary | ICD-10-CM | POA: Diagnosis not present

## 2023-10-09 DIAGNOSIS — I272 Pulmonary hypertension, unspecified: Secondary | ICD-10-CM | POA: Diagnosis not present

## 2023-10-09 DIAGNOSIS — E871 Hypo-osmolality and hyponatremia: Secondary | ICD-10-CM | POA: Diagnosis not present

## 2023-10-23 DIAGNOSIS — E785 Hyperlipidemia, unspecified: Secondary | ICD-10-CM | POA: Diagnosis not present

## 2023-10-23 DIAGNOSIS — R9389 Abnormal findings on diagnostic imaging of other specified body structures: Secondary | ICD-10-CM | POA: Diagnosis not present

## 2023-11-09 DIAGNOSIS — R600 Localized edema: Secondary | ICD-10-CM | POA: Diagnosis not present

## 2023-11-15 DIAGNOSIS — Z6823 Body mass index (BMI) 23.0-23.9, adult: Secondary | ICD-10-CM | POA: Diagnosis not present

## 2023-11-15 DIAGNOSIS — M7121 Synovial cyst of popliteal space [Baker], right knee: Secondary | ICD-10-CM | POA: Diagnosis not present

## 2023-11-15 DIAGNOSIS — R600 Localized edema: Secondary | ICD-10-CM | POA: Diagnosis not present

## 2023-11-20 DIAGNOSIS — M25561 Pain in right knee: Secondary | ICD-10-CM | POA: Diagnosis not present

## 2023-11-20 DIAGNOSIS — M1711 Unilateral primary osteoarthritis, right knee: Secondary | ICD-10-CM | POA: Diagnosis not present

## 2023-11-23 DIAGNOSIS — M7121 Synovial cyst of popliteal space [Baker], right knee: Secondary | ICD-10-CM | POA: Diagnosis not present

## 2023-11-23 DIAGNOSIS — R9389 Abnormal findings on diagnostic imaging of other specified body structures: Secondary | ICD-10-CM | POA: Diagnosis not present

## 2023-11-23 DIAGNOSIS — E785 Hyperlipidemia, unspecified: Secondary | ICD-10-CM | POA: Diagnosis not present

## 2023-12-12 DIAGNOSIS — R634 Abnormal weight loss: Secondary | ICD-10-CM | POA: Diagnosis not present

## 2023-12-12 DIAGNOSIS — Z6823 Body mass index (BMI) 23.0-23.9, adult: Secondary | ICD-10-CM | POA: Diagnosis not present

## 2023-12-12 DIAGNOSIS — R5383 Other fatigue: Secondary | ICD-10-CM | POA: Diagnosis not present

## 2023-12-13 DIAGNOSIS — R634 Abnormal weight loss: Secondary | ICD-10-CM | POA: Diagnosis not present

## 2023-12-13 DIAGNOSIS — R5383 Other fatigue: Secondary | ICD-10-CM | POA: Diagnosis not present

## 2023-12-19 DIAGNOSIS — E538 Deficiency of other specified B group vitamins: Secondary | ICD-10-CM | POA: Diagnosis not present

## 2023-12-21 DIAGNOSIS — M1711 Unilateral primary osteoarthritis, right knee: Secondary | ICD-10-CM | POA: Diagnosis not present

## 2023-12-24 DIAGNOSIS — Z7189 Other specified counseling: Secondary | ICD-10-CM | POA: Diagnosis not present

## 2023-12-24 DIAGNOSIS — E785 Hyperlipidemia, unspecified: Secondary | ICD-10-CM | POA: Diagnosis not present

## 2023-12-24 DIAGNOSIS — R9389 Abnormal findings on diagnostic imaging of other specified body structures: Secondary | ICD-10-CM | POA: Diagnosis not present

## 2023-12-26 DIAGNOSIS — E538 Deficiency of other specified B group vitamins: Secondary | ICD-10-CM | POA: Diagnosis not present

## 2024-01-02 DIAGNOSIS — E538 Deficiency of other specified B group vitamins: Secondary | ICD-10-CM | POA: Diagnosis not present

## 2024-01-02 DIAGNOSIS — Z23 Encounter for immunization: Secondary | ICD-10-CM | POA: Diagnosis not present

## 2024-01-09 DIAGNOSIS — E538 Deficiency of other specified B group vitamins: Secondary | ICD-10-CM | POA: Diagnosis not present

## 2024-01-15 DIAGNOSIS — E538 Deficiency of other specified B group vitamins: Secondary | ICD-10-CM | POA: Diagnosis not present

## 2024-01-23 DIAGNOSIS — R9389 Abnormal findings on diagnostic imaging of other specified body structures: Secondary | ICD-10-CM | POA: Diagnosis not present

## 2024-01-23 DIAGNOSIS — E785 Hyperlipidemia, unspecified: Secondary | ICD-10-CM | POA: Diagnosis not present

## 2024-02-12 DIAGNOSIS — I129 Hypertensive chronic kidney disease with stage 1 through stage 4 chronic kidney disease, or unspecified chronic kidney disease: Secondary | ICD-10-CM | POA: Diagnosis not present

## 2024-02-12 DIAGNOSIS — E785 Hyperlipidemia, unspecified: Secondary | ICD-10-CM | POA: Diagnosis not present

## 2024-02-12 DIAGNOSIS — E1140 Type 2 diabetes mellitus with diabetic neuropathy, unspecified: Secondary | ICD-10-CM | POA: Diagnosis not present
